# Patient Record
Sex: Female | Born: 1995 | Race: Black or African American | Hispanic: No | Marital: Single | Smoking: Never smoker
Health system: Southern US, Community
[De-identification: ages and names within clinical notes are randomized; demographics above are authoritative.]

---

## 2017-07-06 NOTE — L&D Delivery Note (Signed)
   Delivery Note At 12:51 AM a viable female was delivered via Vaginal, Spontaneous (Presentation:LOA, nuchal, delivered through ;  ).  APGAR: 8, 9; weight  .  pending After 1 minute, the cord was clamped and cut. 40 units of pitocin diluted in 1000cc LR was infused rapidly IV.  The placenta separated spontaneously and delivered via CCT and maternal pushing effort.  It was inspected and appears to be intact with a 3 VC Anesthesia:   Episiotomy: None Lacerations:  1st degree labia minora Suture Repair: 2.0 vicryl Est. Blood Loss (mL):  300  Mom to postpartum.  Baby to Couplet care / Skin to Skin.   The above was performed by Derrel Nip, MS-4, under my direct supervision and guidance.   Jasmin Wolfe 03/11/2018, 1:17 AM

## 2017-08-25 ENCOUNTER — Encounter: Payer: Self-pay | Admitting: *Deleted

## 2017-08-25 ENCOUNTER — Ambulatory Visit (INDEPENDENT_AMBULATORY_CARE_PROVIDER_SITE_OTHER): Payer: Medicaid Other | Admitting: Advanced Practice Midwife

## 2017-08-25 ENCOUNTER — Other Ambulatory Visit (HOSPITAL_COMMUNITY)
Admission: RE | Admit: 2017-08-25 | Discharge: 2017-08-25 | Disposition: A | Discharge status: Home / Self Care | Payer: Medicaid Other | Source: Ambulatory Visit | Attending: Advanced Practice Midwife | Admitting: Advanced Practice Midwife

## 2017-08-25 ENCOUNTER — Encounter: Payer: Self-pay | Admitting: Advanced Practice Midwife

## 2017-08-25 VITALS — BP 108/65 | HR 82 | Ht 68.5 in | Wt 236.0 lb

## 2017-08-25 DIAGNOSIS — Z3402 Encounter for supervision of normal first pregnancy, second trimester: Secondary | ICD-10-CM | POA: Insufficient documentation

## 2017-08-25 DIAGNOSIS — Z23 Encounter for immunization: Secondary | ICD-10-CM | POA: Diagnosis not present

## 2017-08-25 DIAGNOSIS — Z3400 Encounter for supervision of normal first pregnancy, unspecified trimester: Secondary | ICD-10-CM

## 2017-08-25 LAB — POCT PREGNANCY, URINE: PREG TEST UR: POSITIVE — AB

## 2017-08-25 NOTE — Progress Notes (Signed)
  Subjective:    Jasmin Wolfe is being seen today for her first obstetrical visit.  This is not a planned pregnancy. She is at 6148w0d gestation. Her obstetrical history is significant for obesity and was smoking marijuana prior to the pregnancy, but none now. . Relationship with FOB: significant other, not living together. Patient does intend to breast feed. Pregnancy history fully reviewed.  Patient reports no complaints.  Review of Systems:   Review of Systems  All other systems reviewed and are negative.   Objective:     BP 108/65   Pulse 82   Ht 5' 8.5" (1.74 m)   Wt 236 lb (107 kg)   LMP 05/26/2017   BMI 35.36 kg/m  Physical Exam  Nursing note and vitals reviewed. Constitutional: She is oriented to person, place, and time. She appears well-developed and well-nourished. No distress.  HENT:  Head: Normocephalic.  Cardiovascular: Normal rate.  Respiratory: Effort normal.  GI: Soft. There is no tenderness. There is no rebound.  Genitourinary:  Genitourinary Comments: External: no lesion Vagina: small amount of white discharge Cervix: pink, smooth, no CMT Uterus: ADA, +FHT with doppler    Neurological: She is alert and oriented to person, place, and time.  Skin: Skin is warm and dry.  Psychiatric: She has a normal mood and affect.    Maternal Exam:  Abdomen: Patient reports no abdominal tenderness. Introitus: Normal vulva. Normal vagina.    Fetal Exam Fetal Monitor Review: Mode: hand-held doppler probe.   Baseline rate: 158.         Assessment:    Pregnancy: G2P0010 There are no active problems to display for this patient.      Plan:     Initial labs drawn. Babyscripts app only Prenatal vitamins. Problem list reviewed and updated. AFP3 discussed: AFP only at next visit if desired . Role of ultrasound in pregnancy discussed; fetal survey: requested. Amniocentesis discussed: not indicated. Follow up in 4 weeks. 50% of 45 min visit spent on  counseling and coordination of care.     Thressa ShellerHeather Kaylie Ritter 08/25/2017

## 2017-08-25 NOTE — Patient Instructions (Signed)
Childbirth Education Options: [facility] Department Classes:  Childbirth education classes can help you get ready for a positive parenting experience. You can also meet other expectant parents and get free stuff for your baby. Each class runs for five weeks on the same night and costs $45 for the mother-to-be and her support person. Medicaid covers the cost if you are eligible. Call [phone] to register. [facility] Childbirth Education:  [phone] or [phone] or [email or web address]  Baby & Me Class: Discuss newborn & infant parenting and family adjustment issues with other new mothers in a relaxed environment. Each week brings a new speaker or baby-centered activity. We encourage new mothers to join [place] every Thursday at 11:00am. Babies birth until crawling. No registration or fee. Daddy [employer]: This course offers Dads-to-be the tools and knowledge needed to feel confident on their journey to becoming new fathers. Experienced dads, who have been trained as coaches, teach dads-to-be how to hold, comfort, diaper, swaddle and play with their infant while being able to support the new mom as well. A class for men taught by men. $25/dad Big Brother/Big Sister: Let your children share in the joy of a new brother or sister in this special class designed just for them. Class includes discussion about how families care for babies: swaddling, holding, diapering, safety as well as how they can be helpful in their new role. This class is designed for children ages 45 to 48, but any age is welcome. Please register each child individually. $5/child  Mom Talk: This mom-led group offers support and connection to mothers as they journey through the adjustments and struggles of that sometimes overwhelming first year after the birth of a child. Tuesdays at 10:00am and Thursdays at 6:00pm. Babies welcome. No registration or fee. Breastfeeding Support Group: This group is a mother-to-mother  support circle where moms have the opportunity to share their breastfeeding experiences. A Lactation Consultant is present for questions and concerns. Meets each Tuesday at 11:00am. No fee or registration. Breastfeeding Your Baby: Learn what to expect in the first days of breastfeeding your newborn.  This class will help you feel more confident with the skills needed to begin your breastfeeding experience. Many new mothers are concerned about breastfeeding after leaving the hospital. This class will also address the most common fears and challenges about breastfeeding during the first few weeks, months and beyond. (call for fee) Comfort Techniques and Tour: This 2 hour interactive class will provide you the opportunity to learn & practice hands-on techniques that can help relieve some of the discomfort of labor and encourage your baby to rotate toward the best position for birth. You and your partner will be able to try a variety of labor positions with birth balls and rebozos as well as practice breathing, relaxation, and visualization techniques. A tour of the [facility] is included with this class. $20 per registrant and support person Childbirth Class- Weekend Option: This class is a Weekend version of our Birth & Baby series. It is designed for parents who have a difficult time fitting several weeks of classes into their schedule. It covers the care of your newborn and the basics of labor and childbirth. It also includes a [place] [facility] and lunch. The class is held two consecutive days: beginning on Friday evening from 6:30 - 8:30 p.m. and the next day, Saturday from 9 a.m. - 4 p.m. (call for fee) Doren Custard Class: Interested in a waterbirth?  This  informational class will help you discover whether waterbirth is the right fit for you. Education about waterbirth itself, supplies you would need and how to assemble your support team is what you can  expect from this class. Some obstetrical practices require this class in order to pursue a waterbirth. (Not all obstetrical practices offer waterbirth-check with your healthcare provider.) Register only the expectant mom, but you are encouraged to bring your partner to class! Required if planning waterbirth, no fee. Infant/Child CPR: Parents, grandparents, babysitters, and friends learn Cardio-Pulmonary Resuscitation skills for infants and children. You will also learn how to treat both conscious and unconscious choking in infants and children. This Family & Friends program does not offer certification. Register each participant individually to ensure that enough mannequins are available. (Call for fee) Grandparent Love: Expecting a grandbaby? This class is for you! Learn about the latest infant care and safety recommendations and ways to support your own child as he or she transitions into the parenting role. Taught by Registered Nurses who are childbirth instructors, but most importantly...they are grandmothers too! $10/person. Childbirth Class- Natural Childbirth: This series of 5 weekly classes is for expectant parents who want to learn and practice natural methods of coping with the process of labor and childbirth. Relaxation, breathing, massage, visualization, role of the partner, and helpful positioning are highlighted. Participants learn how to be confident in their body's ability to give birth. This class will empower and help parents make informed decisions about their own care. Includes discussion that will help new parents transition into the immediate postpartum period. Maternity Care Center Tour of [facility] is included. We suggest taking this class between 25-32 weeks, but it's only a recommendation. $75 per registrant and one support person or $30 Medicaid. Childbirth Class- 3 week Series: This option of 3 weekly classes helps you and your labor partner prepare for childbirth. Newborn  care, labor & birth, cesarean birth, pain management, and comfort techniques are discussed and a Maternity Care Center Tour of [facility] is included. The class meets at the same time, on the same day of the week for 3 consecutive weeks beginning with the starting date you choose. $60 for registrant and one support person.  Marvelous Multiples: Expecting twins, triplets, or more? This class covers the differences in labor, birth, parenting, and breastfeeding issues that face multiples' parents. NICU tour is included. Led by a Certified Childbirth Educator who is the mother of twins. No fee. Caring for Baby: This class is for expectant and adoptive parents who want to learn and practice the most up-to-date newborn care for their babies. Focus is on birth through the first six weeks of life. Topics include feeding, bathing, diapering, crying, umbilical cord care, circumcision care and safe sleep. Parents learn to recognize symptoms of illness and when to call the pediatrician. Register only the mom-to-be and your partner or support person can plan to come with you! $10 per registrant and support person Childbirth Class- online option: This online class offers you the freedom to complete a Birth and Baby series in the comfort of your own home. The flexibility of this option allows you to review sections at your own pace, at times convenient to you and your support people. It includes additional video information, animations, quizzes, and extended activities. Get organized with helpful eClass tools, checklists, and trackers. Once you register online for the class, you will receive an email within a few days to accept the invitation and begin the class when the time   is right for you. The content will be available to you for 60 days. $60 for 60 days of online access for you and your support people.  Local Doulas: Natural Baby Doulas [email or web address] Tel:  [phone] [email or web address] [employer] [phone] [email or web address] The Labor Hassell Halim  (also do waterbirth tub rental) [phone] [email or web address] Triad Birth Doula [phone] [email or web address] NotebookDistributors.fi Sacred Rhythms  [phone] https://sacred-rhythms.com/ [employer] (PADA) [email or web address] https://www.frey.org/ [place] Birth and Baby  http://[email or web address]/ Considering Waterbirth? Guide for patients at [facility] [employer]  Why consider waterbirth?  . Gentle birth for babies . Less pain medicine used in labor . May allow for passive descent/less pushing . May reduce perineal tears  . More mobility and instinctive maternal position changes . Increased maternal relaxation . Reduced blood pressure in labor  Is waterbirth safe? What are the risks of infection, drowning or other complications?  . Infection: o Very low risk (3.7 % for tub vs 4.8% for bed) o 7 in 8000 waterbirths with documented infection o Poorly cleaned equipment most common cause o Slightly lower group B strep transmission rate  . Drowning o Maternal:  - Very low risk   - Related to seizures or fainting o Newborn:  - Very low risk. No evidence of increased risk of respiratory problems in multiple large studies - Physiological protection from breathing under water - Avoid underwater birth if there are any fetal complications - Once baby's head is out of the water, keep it out.  . Birth complication o Some reports of cord trauma, but risk decreased by bringing baby to surface gradually o No evidence of increased risk of shoulder dystocia. Mothers can usually change positions faster in water than in a bed, possibly aiding the maneuvers to free the shoulder.   You must attend a Doren Custard class at [facility]  3rd Wednesday of every month from 7-9pm  [employer] by calling [phone] or online at [email or web address]  Bring [place] the certificate from the class to your prenatal appointment  Meet with a midwife at 36 weeks to see if you can still plan a waterbirth and to sign the consent.   Purchase or rent the following supplies:   Water Birth Pool (Birth Pool in a Box or [place] for instance)  (Tubs start ~$125)  Single-use disposable tub liner designed for your brand of tub  New garden hose labeled "lead-free", "suitable for drinking water",  Electric drain pump to remove water (We recommend 792 gallon per hour or greater pump.)   Separate garden hose to remove the dirty water  Fish net  Bathing suit top (optional)  Long-handled mirror (optional)  Places to purchase or rent supplies  GotWebTools.is for tub purchases and supplies  Waterbirthsolutions.com for tub purchases and supplies  The Labor Ladies (www.thelaborladies.com) $275 for tub rental/set-up & take down/kit   [employer] (http://www.fleming.com/.htm) Information regarding doulas (labor support) who provide pool rentals  Our practice has a Birth Pool in a Box tub at the hospital that you may borrow on a first-come-first-served basis. It is your responsibility to to set up, clean and break down the tub. We cannot guarantee the availability of this tub in advance. You are responsible for bringing all accessories listed above. If you do not have all necessary supplies you cannot have a waterbirth.    Things that would prevent you from having a waterbirth:  Premature, <37wks  Previous cesarean birth  Presence of thick meconium-stained fluid  Multiple gestation (Twins,  triplets, etc.)  Uncontrolled diabetes or gestational diabetes requiring medication  Hypertension requiring medication or diagnosis of pre-eclampsia  Heavy vaginal bleeding  Non-reassuring fetal  heart rate  Active infection (MRSA, etc.). Group B Strep is NOT a contraindication for  waterbirth.  If your labor has to be induced and induction method requires continuous  monitoring of the baby's heart rate  Other risks/issues identified by your obstetrical provider  Please remember that birth is unpredictable. Under certain unforeseeable circumstances your provider may advise against giving birth in the tub. These decisions will be made on a case-by-case basis and with the safety of you and your baby as our highest priority.  Safe Medications in Pregnancy   Acne: Benzoyl Peroxide Salicylic Acid  Backache/Headache: Tylenol: 2 regular strength every 4 hours OR              2 Extra strength every 6 hours  Colds/Coughs/Allergies: Benadryl (alcohol free) 25 mg every 6 hours as needed Breath right strips Claritin Cepacol throat lozenges Chloraseptic throat spray Cold-Eeze- up to three times per day Cough drops, alcohol free Flonase (by prescription only) Guaifenesin Mucinex Robitussin DM (plain only, alcohol free) Saline nasal spray/drops Sudafed (pseudoephedrine) & Actifed ** use only after [redacted] weeks gestation and if you do not have high blood pressure Tylenol Vicks Vaporub Zinc lozenges Zyrtec   Constipation: Colace Ducolax suppositories Fleet enema Glycerin suppositories Metamucil Milk of magnesia Miralax Senokot Smooth move tea  Diarrhea: Kaopectate Imodium A-D  *NO pepto Bismol  Hemorrhoids: Anusol Anusol HC Preparation H Tucks  Indigestion: Tums Maalox Mylanta Zantac  Pepcid  Insomnia: Benadryl (alcohol free) 51m every 6 hours as needed Tylenol PM Unisom, no Gelcaps  Leg Cramps: Tums MagGel  Nausea/Vomiting:  Bonine Dramamine Emetrol Ginger extract Sea bands Meclizine  Nausea medication to take during pregnancy:  Unisom (doxylamine succinate 25 mg tablets) Take one tablet daily at bedtime. If symptoms are not adequately  controlled, the dose can be increased to a maximum recommended dose of two tablets daily (1/2 tablet in the morning, 1/2 tablet mid-afternoon and one at bedtime). Vitamin B6 1054mtablets. Take one tablet twice a day (up to 200 mg per day).  Skin Rashes: Aveeno products Benadryl cream or 2552mvery 6 hours as needed Calamine Lotion 1% cortisone cream  Yeast infection: Gyne-lotrimin 7 Monistat 7   **If taking multiple medications, please check labels to avoid duplicating the same active ingredients **take medication as directed on the label ** Do not exceed 4000 mg of tylenol in 24 hours **Do not take medications that contain aspirin or ibuprofen  AREA PEDLavon1 E. Wen[address] Circleu[address] 400AmericusC  [id number] - [phone] - [phone] PEDIATRICS OF GRETularosaa418 Be[address] StreetiNemahaeDeer CreekC [id number] - [phone] - 336[address] ParSouth GreeleyC  [id number] - [phone] - [phone] Llanos1Ganadolm[address] DriveuiCape May PointGrePhilipC  27423557one - [phone] - [phone] Springs0892 Pendergast StreeteSan DimasC  27417616one - [phone] - [phone] [address]uite 203009gCushingC  27238182one - [phone] - 336Sand Ridge26 East Rockledge StreetuiPresidioeClydeC  27493810one - [phone] - [phone]  [facility] FAMILY MEDICINE AT [facility] [address] [place], [place], [place]  [address] Phone - [phone]   Fax [phone]  [facility] FAMILY MEDICINE AT [facility] [address] [place], [place]  [address] Phone - [phone]   Fax - [phone] [facility] [place] [address] [place], [place]  [address] Phone -  [phone]   Fax - [phone]  [place] [address], [place]  [address] Phone - [phone]   Fax - [phone]  [facility] FAMILY MEDICINE AT [place], [place], [place]  [address] Phone - [phone]   Fax - [place] [address] [place], [place]  [address] Phone - [phone]   Fax - [phone]  [facility] [address] [place], [place]  [address] Phone - [place], [place]  [address] Phone - [phone]   Fax - [place] [address], [place], [place]  [address] Phone - [phone]   Fax - [phone]  [place] [address] [place], [place]  [address] Phone - [phone]   Fax - [place]. [place], [place]  [address] Phone - [phone]   Fax - [place], [place], [place]  [address] Phone - [phone]   Fax - [place] [address] [place], [place]  [address] Phone - [phone]   Fax - [phone]  DAVID RUBIN [address], [place], [place]  [address] Phone - [phone]   Fax - [place] [address], [place], [place]  [address] Phone - [phone]   Fax - [phone]  [place] [address] [place], [place]  [address] Phone - [phone]   Fax - [phone] Arnaldo Natal [address] [place], [place]  [address] Phone - [phone]   Fax - [place] [address] [place], [place]  [address] Phone - [phone]   Fax - [place] [address] [place], [place]  [address] Phone -  [phone]   Fax - [phone]  [place] MD [address] [place] [address] Phone [phone]  Fax [phone]  Places to have your son circumcised:    [facility] [phone] while you are in hospital  [facility] [phone] $244 by 4 wks  [facility] [phone] $175 by 2 wks  [facility] [phone] $250 by 7 days [facility] [phone] $269 by 4 wks  These prices sometimes change but are roughly what you can expect to pay. Please call and confirm pricing.   Circumcision is considered an elective/non-medically necessary procedure. There are many reasons parents decide to have their sons circumsized. During the first year of life circumcised males have a reduced risk of urinary tract infections but after this year the rates between circumcised males and uncircumcised males are the same.  It is safe to have your son circumcised outside of the hospital and the places above perform them regularly.   Deciding about Circumcision in Baby Boys  (Up-to-date The Basics)  What is circumcision?  Circumcision is a surgery that removes the skin that covers the tip of the penis, called the "  foreskin" Circumcision is usually done when a boy is between 24 and 61 days old. In the [place], circumcision is common. In some other countries, fewer boys are circumcised. Circumcision is a common tradition in some religions.  Should I have my baby boy circumcised?  There is no easy answer. Circumcision has some benefits. But it also has risks. After talking with your doctor, you will have to decide for yourself what is right for your family.  What are the benefits of circumcision?  Circumcised boys seem to have slightly lower rates of: ?Urinary tract infections ?Swelling of  the opening at the tip of the penis Circumcised men seem to have slightly lower rates of: ?Urinary tract infections ?Swelling of the opening at the tip of the penis ?Penis cancer ?HIV and other infections that you catch during sex ?Cervical cancer in the women they have sex with Even so, in the [place], the risks of these problems are small - even in boys and men who have not been circumcised. Plus, boys and men who are not circumcised can reduce these extra risks by: ?Cleaning their penis well ?Using condoms during sex  What are the risks of circumcision?  Risks include: ?Bleeding or infection from the surgery ?Damage to or amputation of the penis ?A chance that the doctor will cut off too much or not enough of the foreskin ?A chance that sex won't feel as good later in life Only about 1 out of every 200 circumcisions leads to problems. There is also a chance that your health insurance won't pay for circumcision.  How is circumcision done in baby boys?  First, the baby gets medicine for pain relief. This might be a cream on the skin or a shot into the base of the penis. Next, the doctor cleans the baby's penis well. Then he or she uses special tools to cut off the foreskin. Finally, the doctor wraps a bandage (called gauze) around the baby's penis. If you have your baby circumcised, his doctor or nurse will give you instructions on how to care for him after the surgery. It is important that you follow those instructions carefully.

## 2017-08-26 LAB — CYTOLOGY - PAP
CHLAMYDIA, DNA PROBE: NEGATIVE
Diagnosis: NEGATIVE
Neisseria Gonorrhea: NEGATIVE

## 2017-08-27 LAB — URINE CULTURE, OB REFLEX

## 2017-08-27 LAB — CULTURE, OB URINE

## 2017-08-31 ENCOUNTER — Other Ambulatory Visit: Payer: Self-pay | Admitting: Advanced Practice Midwife

## 2017-08-31 ENCOUNTER — Encounter: Payer: Self-pay | Admitting: Advanced Practice Midwife

## 2017-08-31 DIAGNOSIS — Z3400 Encounter for supervision of normal first pregnancy, unspecified trimester: Secondary | ICD-10-CM

## 2017-08-31 LAB — CYSTIC FIBROSIS GENE TEST

## 2017-09-03 ENCOUNTER — Encounter: Payer: Self-pay | Admitting: *Deleted

## 2017-09-03 ENCOUNTER — Other Ambulatory Visit: Payer: Self-pay | Admitting: Advanced Practice Midwife

## 2017-09-03 DIAGNOSIS — Z3400 Encounter for supervision of normal first pregnancy, unspecified trimester: Secondary | ICD-10-CM

## 2017-09-03 LAB — OBSTETRIC PANEL, INCLUDING HIV
Antibody Screen: NEGATIVE
BASOS ABS: 0 10*3/uL (ref 0.0–0.2)
Basos: 0 %
EOS (ABSOLUTE): 0.1 10*3/uL (ref 0.0–0.4)
EOS: 2 %
HEMOGLOBIN: 11.5 g/dL (ref 11.1–15.9)
HIV Screen 4th Generation wRfx: NONREACTIVE
Hematocrit: 36.2 % (ref 34.0–46.6)
Hepatitis B Surface Ag: NEGATIVE
IMMATURE GRANS (ABS): 0 10*3/uL (ref 0.0–0.1)
Immature Granulocytes: 0 %
Lymphocytes Absolute: 1.3 10*3/uL (ref 0.7–3.1)
Lymphs: 24 %
MCH: 28.7 pg (ref 26.6–33.0)
MCHC: 31.8 g/dL (ref 31.5–35.7)
MCV: 90 fL (ref 79–97)
MONOS ABS: 0.4 10*3/uL (ref 0.1–0.9)
Monocytes: 8 %
NEUTROS PCT: 66 %
Neutrophils Absolute: 3.6 10*3/uL (ref 1.4–7.0)
Platelets: 257 10*3/uL (ref 150–379)
RBC: 4.01 x10E6/uL (ref 3.77–5.28)
RDW: 13.5 % (ref 12.3–15.4)
RPR Ser Ql: NONREACTIVE
Rh Factor: POSITIVE
Rubella Antibodies, IGG: 2.63 index (ref >0.99)
WBC: 5.5 10*3/uL (ref 3.4–10.8)

## 2017-09-03 LAB — SMN1 COPY NUMBER ANALYSIS (SMA CARRIER SCREENING)

## 2017-09-03 LAB — HEMOGLOBINOPATHY EVALUATION
Ferritin: 130 ng/mL (ref 15–150)
HGB A: 97.8 % (ref 96.4–98.8)
HGB F QUANT: 0 % (ref 0.0–2.0)
HGB SOLUBILITY: NEGATIVE
Hgb A2 Quant: 2.2 % (ref 1.8–3.2)
Hgb C: 0 %
Hgb S: 0 %
Hgb Variant: 0 %

## 2017-09-08 ENCOUNTER — Encounter: Payer: Self-pay | Admitting: Family Medicine

## 2017-09-08 DIAGNOSIS — O99212 Obesity complicating pregnancy, second trimester: Secondary | ICD-10-CM | POA: Insufficient documentation

## 2017-09-08 DIAGNOSIS — E669 Obesity, unspecified: Secondary | ICD-10-CM | POA: Insufficient documentation

## 2017-09-09 ENCOUNTER — Encounter: Payer: Self-pay | Admitting: Advanced Practice Midwife

## 2017-09-22 ENCOUNTER — Encounter: Payer: Self-pay | Admitting: Advanced Practice Midwife

## 2017-09-22 ENCOUNTER — Ambulatory Visit (INDEPENDENT_AMBULATORY_CARE_PROVIDER_SITE_OTHER): Payer: Medicaid Other | Admitting: Advanced Practice Midwife

## 2017-09-22 DIAGNOSIS — Z3402 Encounter for supervision of normal first pregnancy, second trimester: Secondary | ICD-10-CM

## 2017-09-22 DIAGNOSIS — Z3400 Encounter for supervision of normal first pregnancy, unspecified trimester: Secondary | ICD-10-CM

## 2017-09-22 MED ORDER — PRENATAL GUMMIES/DHA & FA 0.4-32.5 MG PO CHEW: 1.0000 | CHEWABLE_TABLET | Freq: Every day | ORAL | 11 refills | Status: AC

## 2017-09-22 MED ORDER — DOCUSATE SODIUM 100 MG PO CAPS: 100.0000 mg | ORAL_CAPSULE | Freq: Two times a day (BID) | ORAL | 3 refills | Status: DC

## 2017-09-22 MED ORDER — ONDANSETRON 8 MG PO TBDP: 8.0000 mg | ORAL_TABLET | Freq: Three times a day (TID) | ORAL | 2 refills | Status: DC | PRN

## 2017-09-22 NOTE — Patient Instructions (Signed)
AREA PEDIATRIC/FAMILY PRACTICE PHYSICIANS   [facility] [address], [address] [place], [place]  [address] Phone - [phone]   Fax - [phone]  [facility] [address] [place], [place] [address] Phone - [phone]   Fax - [phone]  J[facility] AM[facility] [address] [place], [place]  [address] Phone - [phone]   Fax - [phone]  [facility] [address] [place], [place]  [address] Phone - [phone]   Fax - [phone]  [facility] [address] [place], [place]  [address] Phone - [phone]   Fax - [phone]  [facility] [address] [place], [place]  [address] Phone - [phone]   Fax - [phone]  [facility] [address] [place], [place]  [address] Phone - [phone]   Fax - [phone]  [facility] [address], [address] [place], [place]  [address] Phone - [phone]   Fax - [phone]  [facility] [address] [place], [place]  [address] Phone - [phone]   Fax - [phone] [facility] [address] [place], [place]  [address] Phone - [phone]   Fax - [phone]  [facility] [address] [place], [place]  [address] Phone - [phone]   Fax - [phone]  [facility] [address] [place], [place]  [address] Phone - [phone]   Fax - [phone]  [facility] [address] [place], [place]  [address] Phone - [phone]   Fax - [phone]  SHILPA GOSRANI [address] [place], [place]  [address] Phone - [phone]  [facility] [address] [place], [place]  [address] Phone - [phone]   Fax - [phone]  [facility] [address] [place], [place]  [address] Phone - [phone]   Fax - [phone]  [facility] [address] [place], [place]  [address] Phone - [phone]   Fax - [phone]  [facility] [address] [place], [place]  [address] Phone - [phone]   Fax - [phone]   [facility] [address] [place], [place]  [address] Phone - [phone]   Fax - [phone]  [facility] [address] [place], [place]  [address] Phone - [phone]   Fax - [phone]  DAVID RUBIN [address] [place], [place]  [address] Phone - [phone]   Fax - [phone]  [facility] [address] [place], [place]  [address] Phone - [phone]   Fax - [phone]  [facility] [address] , [place]  [address] Phone - [phone]   Fax - [phone] [facility] [address] [place], [place]  [address] Phone - [phone]   Fax - [phone]  [facility] [address] [place], [place]  [address] Phone - [phone]   Fax - [phone]  [facility] [address] [place], [place]  [address] Phone - [phone]   Fax - [phone]  [facility] Charlene Flemming MD [address] [place]  [address] Phone [phone]  Fax [phone]  Childbirth Education Options: [facility] Classes:  Childbirth education classes can help you   get ready for a positive parenting experience. You can also meet other expectant parents and get free stuff for your baby. Each class runs for five weeks on the same night and costs $45 for the mother-to-be and her support person. Medicaid covers the cost if you are eligible. Call [phone] to register. [facility] Childbirth Education:  [phone] or [phone] or  [email or web address]  Baby & Me Class: Discuss newborn & infant parenting and family adjustment issues with other new mothers in a relaxed environment. Each week brings a new speaker or baby-centered activity. We encourage new mothers to join us every Thursday at 11:00am. Babies birth until crawling. No registration or fee. Daddy Boot Camp: This course offers Dads-to-be the tools and knowledge needed to feel confident on their journey to becoming new fathers. Experienced dads, who have been trained as coaches, teach dads-to-be how to hold, comfort, diaper, swaddle and play with their infant while being able to support the new mom as well. A class for men taught by men. $25/dad Big Brother/Big Sister: Let your children share in the joy of a new brother or sister in this special class designed just for them. Class includes discussion about how families care for babies: swaddling, holding, diapering, safety as well as how they can be helpful in their new role. This class is designed for children ages 2 to 6, but any age is welcome. Please register each child individually. $5/child  Mom Talk: This mom-led group offers support and connection to mothers as they journey through the adjustments and struggles of that sometimes overwhelming first year after the birth of a child. Tuesdays at 10:00am and Thursdays at 6:00pm. Babies welcome. No registration or fee. Breastfeeding Support Group: This group is a mother-to-mother support circle where moms have the opportunity to share their breastfeeding experiences. A Lactation Consultant is present for questions and concerns. Meets each Tuesday at 11:00am. No fee or registration. Breastfeeding Your Baby: Learn what to expect in the first days of breastfeeding your newborn.  This class will help you feel more confident with the skills needed to begin your breastfeeding experience. Many new mothers are concerned about breastfeeding after leaving the hospital. This class  will also address the most common fears and challenges about breastfeeding during the first few weeks, months and beyond. (call for fee) Comfort Techniques and Tour: This 2 hour interactive class will provide you the opportunity to learn & practice hands-on techniques that can help relieve some of the discomfort of labor and encourage your baby to rotate toward the best position for birth. You and your partner will be able to try a variety of labor positions with birth balls and rebozos as well as practice breathing, relaxation, and visualization techniques. A tour of the [facility] is included with this class. $20 per registrant and support person Childbirth Class- Weekend Option: This class is a Weekend version of our Birth & Baby series. It is designed for parents who have a difficult time fitting several weeks of classes into their schedule. It covers the care of your newborn and the basics of labor and childbirth. It also includes a Maternity Care Center Tour of [facility] and lunch. The class is held two consecutive days: beginning on Friday evening from 6:30 - 8:30 p.m. and the next day, Saturday from 9 a.m. - 4 p.m. (call for fee) Waterbirth Class: Interested in a waterbirth?  This informational class will help you discover whether waterbirth is the right fit for you.   Education about waterbirth itself, supplies you would need and how to assemble your support team is what you can expect from this class. Some obstetrical practices require this class in order to pursue a waterbirth. (Not all obstetrical practices offer waterbirth-check with your healthcare provider.) Register only the expectant mom, but you are encouraged to bring your partner to class! Required if planning waterbirth, no fee. Infant/Child CPR: Parents, grandparents, babysitters, and friends learn Cardio-Pulmonary Resuscitation skills for infants and children. You will also learn how to treat both conscious  and unconscious choking in infants and children. This Family & Friends program does not offer certification. Register each participant individually to ensure that enough mannequins are available. (Call for fee) Grandparent Love: Expecting a grandbaby? This class is for you! Learn about the latest infant care and safety recommendations and ways to support your own child as he or she transitions into the parenting role. Taught by Registered Nurses who are childbirth instructors, but most importantly...they are grandmothers too! $10/person. Childbirth Class- Natural Childbirth: This series of 5 weekly classes is for expectant parents who want to learn and practice natural methods of coping with the process of labor and childbirth. Relaxation, breathing, massage, visualization, role of the partner, and helpful positioning are highlighted. Participants learn how to be confident in their body's ability to give birth. This class will empower and help parents make informed decisions about their own care. Includes discussion that will help new parents transition into the immediate postpartum period. [facility] is included. We suggest taking this class between 25-32 weeks, but it's only a recommendation. $75 per registrant and one support person or $30 Medicaid. Childbirth Class- 3 week Series: This option of 3 weekly classes helps you and your labor partner prepare for childbirth. Newborn care, labor & birth, cesarean birth, pain management, and comfort techniques are discussed and a [place] [facility] is included. The class meets at the same time, on the same day of the week for 3 consecutive weeks beginning with the starting date you choose. $60 for registrant and one support person.  Marvelous Multiples: Expecting twins, triplets, or more? This class covers the differences in labor, birth, parenting, and breastfeeding issues that face multiples' parents.  NICU tour is included. Led by a Certified Childbirth Educator who is the mother of twins. No fee. Caring for Baby: This class is for expectant and adoptive parents who want to learn and practice the most up-to-date newborn care for their babies. Focus is on birth through the first six weeks of life. Topics include feeding, bathing, diapering, crying, umbilical cord care, circumcision care and safe sleep. Parents learn to recognize symptoms of illness and when to call the pediatrician. Register only the mom-to-be and your partner or support person can plan to come with you! $10 per registrant and support person Childbirth Class- online option: This online class offers you the freedom to complete a Birth and Baby series in the comfort of your own home. The flexibility of this option allows you to review sections at your own pace, at times convenient to you and your support people. It includes additional video information, animations, quizzes, and extended activities. Get organized with helpful eClass tools, checklists, and trackers. Once you register online for the class, you will receive an email within a few days to accept the invitation and begin the class when the time is right for you. The content will be available to you for 60 days. $  60 for 60 days of online access for you and your support people.  Local Doulas: Natural Baby Doulas [email or web address] Tel: [phone] [email or web address] [employer] [phone] [email or web address] The Labor Merla RichesLadies  (also do waterbirth tub rental) [phone] [email or web address] [facility] [phone] [email or web address] [facility]  [phone] [email or web address] [employer] (PADA) [email or web address] [place] [facility]   [email or web address]  Safe Medications in Pregnancy   Acne: Benzoyl Peroxide Salicylic Acid  Backache/Headache: Tylenol: 2 regular strength every 4 hours OR              2 Extra strength every 6 hours  Colds/Coughs/Allergies: Benadryl (alcohol free) 25 mg every 6 hours as needed Breath right strips Claritin Cepacol throat lozenges Chloraseptic throat spray Cold-Eeze- up to three times per day Cough drops, alcohol free Flonase (by prescription only) Guaifenesin Mucinex Robitussin DM (plain only, alcohol free) Saline nasal spray/drops Sudafed (pseudoephedrine) & Actifed ** use only after [redacted] weeks gestation and if you do not have high blood pressure Tylenol Vicks Vaporub Zinc lozenges Zyrtec   Constipation: Colace Ducolax suppositories Fleet enema Glycerin suppositories Metamucil Milk of magnesia Miralax Senokot Smooth move tea  Diarrhea: Kaopectate Imodium A-D  *NO pepto Bismol  Hemorrhoids: Anusol Anusol HC Preparation H Tucks  Indigestion: Tums Maalox Mylanta Zantac  Pepcid  Insomnia: Benadryl (alcohol free) 25mg  every 6 hours as needed Tylenol PM Unisom, no Gelcaps  Leg Cramps: Tums MagGel  Nausea/Vomiting:  Bonine Dramamine Emetrol Ginger extract Sea bands Meclizine  Nausea medication to take during pregnancy:  Unisom (doxylamine succinate 25 mg tablets) Take one tablet daily at bedtime. If symptoms are not adequately controlled, the dose can be increased to a maximum recommended dose of two tablets daily (1/2 tablet in the morning, 1/2 tablet mid-afternoon and one at bedtime). Vitamin B6 100mg  tablets. Take one tablet twice a day (up to 200 mg per day).  Skin Rashes: Aveeno products Benadryl cream or 25mg  every 6 hours as needed Calamine Lotion 1% cortisone cream  Yeast infection: Gyne-lotrimin 7 Monistat 7   **If taking multiple medications, please check labels to avoid duplicating the same active  ingredients **take medication as directed on the label ** Do not exceed 4000 mg of tylenol in 24 hours **Do not take medications that contain aspirin or ibuprofen

## 2017-09-22 NOTE — Progress Notes (Signed)
   PRENATAL VISIT NOTE  Subjective:  Jasmin Wolfe is a 22 y.o. G2P0010 at 4379w0d being seen today for ongoing prenatal care.  She is currently monitored for the following issues for this low-risk pregnancy and has Supervision of normal first pregnancy, antepartum; Obesity affecting pregnancy, antepartum; and Obesity, Class II, BMI 35-39.9 on their problem list.  Patient reports no complaints.  Contractions: Not present. Vag. Bleeding: None.  Movement: Absent. Denies leaking of fluid.   Some nausea and constipation.   The following portions of the patient's history were reviewed and updated as appropriate: allergies, current medications, past family history, past medical history, past social history, past surgical history and problem list. Problem list updated.  Objective:   Vitals:   09/22/17 0827  BP: 120/66  Pulse: 80  Weight: 230 lb 6.4 oz (104.5 kg)    Fetal Status: Fetal Heart Rate (bpm): 142   Movement: Absent     General:  Alert, oriented and cooperative. Patient is in no acute distress.  Skin: Skin is warm and dry. No rash noted.   Cardiovascular: Normal heart rate noted  Respiratory: Normal respiratory effort, no problems with respiration noted  Abdomen: Soft, gravid, appropriate for gestational age.  Pain/Pressure: Absent     Pelvic: Cervical exam deferred        Extremities: Normal range of motion.  Edema: None  Mental Status:  Normal mood and affect. Normal behavior. Normal judgment and thought content.   Assessment and Plan:  Pregnancy: G2P0010 at 7379w0d  1. Supervision of normal first pregnancy, antepartum  - US MFM OB COMP + 14 WK; Future - AFP only - RX: prenatal gummies, colace, zofran   Preterm labor symptoms and general obstetric precautions including but not limited to vaginal bleeding, contractions, leaking of fluid and fetal movement were reviewed in detail with the patient. Please refer to After Visit Summary for other counseling recommendations.    Return in about 4 weeks (around 10/20/2017).   Thressa ShellerHeather Hogan, CNM

## 2017-09-22 NOTE — Progress Notes (Signed)
Pt desires Rx for nausea and prenatal gummies.  Pt reports constipation - recommend Colace and increase po water intake and fruits and vegetables.

## 2017-09-24 LAB — AFP, SERUM, OPEN SPINA BIFIDA
AFP MoM: 1.39
AFP Value: 43.6 ng/mL
GEST. AGE ON COLLECTION DATE: 17 wk
Maternal Age At EDD: 22.2 yr
OSBR RISK 1 IN: 7407
Test Results:: NEGATIVE
WEIGHT: 230 [lb_av]

## 2017-10-08 ENCOUNTER — Ambulatory Visit (HOSPITAL_COMMUNITY)
Admission: RE | Admit: 2017-10-08 | Discharge: 2017-10-08 | Disposition: A | Discharge status: Home / Self Care | Payer: Medicaid Other | Source: Ambulatory Visit | Attending: Advanced Practice Midwife | Admitting: Advanced Practice Midwife

## 2017-10-08 ENCOUNTER — Other Ambulatory Visit: Payer: Self-pay | Admitting: Advanced Practice Midwife

## 2017-10-08 DIAGNOSIS — Z3689 Encounter for other specified antenatal screening: Secondary | ICD-10-CM | POA: Diagnosis not present

## 2017-10-08 DIAGNOSIS — Z3A19 19 weeks gestation of pregnancy: Secondary | ICD-10-CM | POA: Diagnosis not present

## 2017-10-08 DIAGNOSIS — Z3400 Encounter for supervision of normal first pregnancy, unspecified trimester: Secondary | ICD-10-CM

## 2017-10-08 DIAGNOSIS — O99212 Obesity complicating pregnancy, second trimester: Secondary | ICD-10-CM | POA: Diagnosis not present

## 2017-10-20 ENCOUNTER — Ambulatory Visit (INDEPENDENT_AMBULATORY_CARE_PROVIDER_SITE_OTHER): Payer: Medicaid Other | Admitting: Medical

## 2017-10-20 VITALS — BP 102/60 | HR 73 | Wt 230.0 lb

## 2017-10-20 DIAGNOSIS — IMO0002 Reserved for concepts with insufficient information to code with codable children: Secondary | ICD-10-CM

## 2017-10-20 DIAGNOSIS — Z0489 Encounter for examination and observation for other specified reasons: Secondary | ICD-10-CM

## 2017-10-20 DIAGNOSIS — Z0482 Encounter for examination and observation of victim following forced labor exploitation: Secondary | ICD-10-CM

## 2017-10-20 NOTE — Progress Notes (Signed)
   PRENATAL VISIT NOTE  Subjective:  Jasmin Wolfe is a 22 y.o. G2P0010 at 7567w0d being seen today for ongoing prenatal care.  She is currently monitored for the following issues for this low-risk pregnancy and has Supervision of normal first pregnancy, antepartum; Obesity affecting pregnancy, antepartum; and Obesity, Class II, BMI 35-39.9 on their problem list.  Patient reports allergy symptoms.  Contractions: Not present. Vag. Bleeding: None.  Movement: Present. Denies leaking of fluid.   The following portions of the patient's history were reviewed and updated as appropriate: allergies, current medications, past family history, past medical history, past social history, past surgical history and problem list. Problem list updated.  Objective:   Vitals:   10/20/17 0822  BP: 102/60  Pulse: 73  Weight: 230 lb (104.3 kg)    Fetal Status: Fetal Heart Rate (bpm): 142   Movement: Present     General:  Alert, oriented and cooperative. Patient is in no acute distress.  Skin: Skin is warm and dry. No rash noted.   Cardiovascular: Normal heart rate noted  Respiratory: Normal respiratory effort, no problems with respiration noted  Abdomen: Soft, gravid, appropriate for gestational age.  Pain/Pressure: Present     Pelvic: Cervical exam deferred        Extremities: Normal range of motion.  Edema: None  Mental Status: Normal mood and affect. Normal behavior. Normal judgment and thought content.   Assessment and Plan:  Pregnancy: G2P0010 at 3367w0d  1. Evaluate anatomy not seen on prior sonogram - US MFM OB FOLLOW UP; scheduled  Preterm labor/ second trimester symptoms and general obstetric precautions including but not limited to vaginal bleeding, contractions, leaking of fluid and fetal movement were reviewed in detail with the patient. Please refer to After Visit Summary for other counseling recommendations.  Return in about 1 month (around 11/17/2017) for LOB.  Vonzella NippleJulie Florita Nitsch, PA-C

## 2017-10-20 NOTE — Patient Instructions (Signed)
Second Trimester of Pregnancy The second trimester is from week 13 through week 28, month 4 through 6. This is often the time in pregnancy that you feel your best. Often times, morning sickness has lessened or quit. You may have more energy, and you may get hungry more often. Your unborn baby (fetus) is growing rapidly. At the end of the sixth month, he or she is about 9 inches long and weighs about 1 pounds. You will likely feel the baby move (quickening) between 18 and 20 weeks of pregnancy. Follow these instructions at home:  Avoid all smoking, herbs, and alcohol. Avoid drugs not approved by your doctor.  Do not use any tobacco products, including cigarettes, chewing tobacco, and electronic cigarettes. If you need help quitting, ask your doctor. You may get counseling or other support to help you quit.  Only take medicine as told by your doctor. Some medicines are safe and some are not during pregnancy.  Exercise only as told by your doctor. Stop exercising if you start having cramps.  Eat regular, healthy meals.  Wear a good support bra if your breasts are tender.  Do not use hot tubs, steam rooms, or saunas.  Wear your seat belt when driving.  Avoid raw meat, uncooked cheese, and liter boxes and soil used by cats.  Take your prenatal vitamins.  Take 1500-2000 milligrams of calcium daily starting at the 20th week of pregnancy until you deliver your baby.  Try taking medicine that helps you poop (stool softener) as needed, and if your doctor approves. Eat more fiber by eating fresh fruit, vegetables, and whole grains. Drink enough fluids to keep your pee (urine) clear or pale yellow.  Take warm water baths (sitz baths) to soothe pain or discomfort caused by hemorrhoids. Use hemorrhoid cream if your doctor approves.  If you have puffy, bulging veins (varicose veins), wear support hose. Raise (elevate) your feet for 15 minutes, 3-4 times a day. Limit salt in your diet.  Avoid heavy  lifting, wear low heals, and sit up straight.  Rest with your legs raised if you have leg cramps or low back pain.  Visit your dentist if you have not gone during your pregnancy. Use a soft toothbrush to brush your teeth. Be gentle when you floss.  You can have sex (intercourse) unless your doctor tells you not to.  Go to your doctor visits. Get help if:  You feel dizzy.  You have mild cramps or pressure in your lower belly (abdomen).  You have a nagging pain in your belly area.  You continue to feel sick to your stomach (nauseous), throw up (vomit), or have watery poop (diarrhea).  You have bad smelling fluid coming from your vagina.  You have pain with peeing (urination). Get help right away if:  You have a fever.  You are leaking fluid from your vagina.  You have spotting or bleeding from your vagina.  You have severe belly cramping or pain.  You lose or gain weight rapidly.  You have trouble catching your breath and have chest pain.  You notice sudden or extreme puffiness (swelling) of your face, hands, ankles, feet, or legs.  You have not felt the baby move in over an hour.  You have severe headaches that do not go away with medicine.  You have vision changes. This information is not intended to replace advice given to you by your health care provider. Make sure you discuss any questions you have with your health care   provider. Document Released: 09/16/2009 Document Revised: 11/28/2015 Document Reviewed: 08/23/2012 Elsevier Interactive Patient Education  2017 [employer]vier Inc.  

## 2017-11-05 ENCOUNTER — Other Ambulatory Visit: Payer: Self-pay | Admitting: Medical

## 2017-11-05 ENCOUNTER — Ambulatory Visit (HOSPITAL_COMMUNITY)
Admission: RE | Admit: 2017-11-05 | Discharge: 2017-11-05 | Disposition: A | Discharge status: Home / Self Care | Payer: Medicaid Other | Source: Ambulatory Visit | Attending: Medical | Admitting: Medical

## 2017-11-05 DIAGNOSIS — O99212 Obesity complicating pregnancy, second trimester: Secondary | ICD-10-CM

## 2017-11-05 DIAGNOSIS — Z0489 Encounter for examination and observation for other specified reasons: Secondary | ICD-10-CM

## 2017-11-05 DIAGNOSIS — Z362 Encounter for other antenatal screening follow-up: Secondary | ICD-10-CM

## 2017-11-05 DIAGNOSIS — Z3A23 23 weeks gestation of pregnancy: Secondary | ICD-10-CM | POA: Insufficient documentation

## 2017-11-05 DIAGNOSIS — IMO0002 Reserved for concepts with insufficient information to code with codable children: Secondary | ICD-10-CM

## 2017-11-24 ENCOUNTER — Ambulatory Visit (INDEPENDENT_AMBULATORY_CARE_PROVIDER_SITE_OTHER): Payer: Medicaid Other | Admitting: Medical

## 2017-11-24 VITALS — BP 105/66 | HR 85 | Wt 225.2 lb

## 2017-11-24 DIAGNOSIS — Z3482 Encounter for supervision of other normal pregnancy, second trimester: Secondary | ICD-10-CM

## 2017-11-24 DIAGNOSIS — Z3400 Encounter for supervision of normal first pregnancy, unspecified trimester: Secondary | ICD-10-CM

## 2017-11-24 NOTE — Patient Instructions (Addendum)
Nausea in pregnancy is when you feel sick to your stomach (nauseous) during pregnancy. You may feel sick to your stomach and throw up (vomit). You may feel sick in the morning, but you can feel this way any time of day. Some women feel very sick to their stomach and cannot stop throwing up (hyperemesis gravidarum). Follow these instructions at home:  Only take medicines as told by your doctor.  Take multivitamins as told by your doctor. Taking multivitamins before getting pregnant can stop or lessen the harshness of morning sickness.  Eat dry toast or unsalted crackers before getting out of bed.  Eat 5 to 6 small meals a day.  Eat dry and bland foods like rice and baked potatoes.  Do not drink liquids with meals. Drink between meals.  Do not eat greasy, fatty, or spicy foods.  Have someone cook for you if the smell of food causes you to feel sick or throw up.  If you feel sick to your stomach after taking prenatal vitamins, take them at night or with a snack.  Eat protein when you need a snack (nuts, yogurt, cheese).  Eat unsweetened gelatins for dessert.  Wear a bracelet used for sea sickness (acupressure wristband).  Go to a doctor that puts thin needles into certain body points (acupuncture) to improve how you feel.  Do not smoke.  Use a humidifier to keep the air in your house free of odors.  Get lots of fresh air. Contact a doctor if:  You need medicine to feel better.  You feel dizzy or lightheaded.  You are losing weight. Get help right away if:  You feel very sick to your stomach and cannot stop throwing up.  You pass out (faint). This information is not intended to replace advice given to you by your health care provider. Make sure you discuss any questions you have with your health care provider. Document Released: 07/30/2004 Document Revised: 11/28/2015 Document Reviewed: 12/07/2012 Elsevier Interactive Patient Education  2017 Door  Education Options: [facility] Department Classes:  Childbirth education classes can help you get ready for a positive parenting experience. You can also meet other expectant parents and get free stuff for your baby. Each class runs for five weeks on the same night and costs $45 for the mother-to-be and her support person. Medicaid covers the cost if you are eligible. Call [phone] to register. [facility] Childbirth Education:  [phone] or [phone] or [email or web address]  Baby & Me Class: Discuss newborn & infant parenting and family adjustment issues with other new mothers in a relaxed environment. Each week brings a new speaker or baby-centered activity. We encourage new mothers to join [place] every Thursday at 11:00am. Babies birth until crawling. No registration or fee. Daddy [employer]: This course offers Dads-to-be the tools and knowledge needed to feel confident on their journey to becoming new fathers. Experienced dads, who have been trained as coaches, teach dads-to-be how to hold, comfort, diaper, swaddle and play with their infant while being able to support the new mom as well. A class for men taught by men. $25/dad Big Brother/Big Sister: Let your children share in the joy of a new brother or sister in this special class designed just for them. Class includes discussion about how families care for babies: swaddling, holding, diapering, safety as well as how they can be helpful in their new role. This class is designed for children ages 27 to 83, but any age is welcome. Please register  each child individually. $5/child  Mom Talk: This mom-led group offers support and connection to mothers as they journey through the adjustments and struggles of that sometimes overwhelming first year after the birth of a child. Tuesdays at 10:00am and Thursdays at 6:00pm. Babies welcome. No registration or fee. Breastfeeding Support Group: This group is a mother-to-mother support  circle where moms have the opportunity to share their breastfeeding experiences. A Lactation Consultant is present for questions and concerns. Meets each Tuesday at 11:00am. No fee or registration. Breastfeeding Your Baby: Learn what to expect in the first days of breastfeeding your newborn.  This class will help you feel more confident with the skills needed to begin your breastfeeding experience. Many new mothers are concerned about breastfeeding after leaving the hospital. This class will also address the most common fears and challenges about breastfeeding during the first few weeks, months and beyond. (call for fee) Comfort Techniques and Tour: This 2 hour interactive class will provide you the opportunity to learn & practice hands-on techniques that can help relieve some of the discomfort of labor and encourage your baby to rotate toward the best position for birth. You and your partner will be able to try a variety of labor positions with birth balls and rebozos as well as practice breathing, relaxation, and visualization techniques. A tour of the [facility] is included with this class. $20 per registrant and support person Childbirth Class- Weekend Option: This class is a Weekend version of our Birth & Baby series. It is designed for parents who have a difficult time fitting several weeks of classes into their schedule. It covers the care of your newborn and the basics of labor and childbirth. It also includes a [place] [facility] and lunch. The class is held two consecutive days: beginning on Friday evening from 6:30 - 8:30 p.m. and the next day, Saturday from 9 a.m. - 4 p.m. (call for fee) Doren Custard Class: Interested in a waterbirth?  This informational class will help you discover whether waterbirth is the right fit for you. Education about waterbirth itself, supplies you would need and how to assemble your support team is what you can expect  from this class. Some obstetrical practices require this class in order to pursue a waterbirth. (Not all obstetrical practices offer waterbirth-check with your healthcare provider.) Register only the expectant mom, but you are encouraged to bring your partner to class! Required if planning waterbirth, no fee. Infant/Child CPR: Parents, grandparents, babysitters, and friends learn Cardio-Pulmonary Resuscitation skills for infants and children. You will also learn how to treat both conscious and unconscious choking in infants and children. This Family & Friends program does not offer certification. Register each participant individually to ensure that enough mannequins are available. (Call for fee) Grandparent Love: Expecting a grandbaby? This class is for you! Learn about the latest infant care and safety recommendations and ways to support your own child as he or she transitions into the parenting role. Taught by Registered Nurses who are childbirth instructors, but most importantly...they are grandmothers too! $10/person. Childbirth Class- Natural Childbirth: This series of 5 weekly classes is for expectant parents who want to learn and practice natural methods of coping with the process of labor and childbirth. Relaxation, breathing, massage, visualization, role of the partner, and helpful positioning are highlighted. Participants learn how to be confident in their body's ability to give birth. This class will empower and help parents make informed decisions about their own  care. Includes discussion that will help new parents transition into the immediate postpartum period. [facility] is included. We suggest taking this class between 25-32 weeks, but it's only a recommendation. $75 per registrant and one support person or $30 Medicaid. Childbirth Class- 3 week Series: This option of 3 weekly classes helps you and your labor partner prepare for childbirth. Newborn care, labor  & birth, cesarean birth, pain management, and comfort techniques are discussed and a [place] [facility] is included. The class meets at the same time, on the same day of the week for 3 consecutive weeks beginning with the starting date you choose. $60 for registrant and one support person.  Marvelous Multiples: Expecting twins, triplets, or more? This class covers the differences in labor, birth, parenting, and breastfeeding issues that face multiples' parents. NICU tour is included. Led by a Certified Childbirth Educator who is the mother of twins. No fee. Caring for Baby: This class is for expectant and adoptive parents who want to learn and practice the most up-to-date newborn care for their babies. Focus is on birth through the first six weeks of life. Topics include feeding, bathing, diapering, crying, umbilical cord care, circumcision care and safe sleep. Parents learn to recognize symptoms of illness and when to call the pediatrician. Register only the mom-to-be and your partner or support person can plan to come with you! $10 per registrant and support person Childbirth Class- online option: This online class offers you the freedom to complete a Birth and Baby series in the comfort of your own home. The flexibility of this option allows you to review sections at your own pace, at times convenient to you and your support people. It includes additional video information, animations, quizzes, and extended activities. Get organized with helpful eClass tools, checklists, and trackers. Once you register online for the class, you will receive an email within a few days to accept the invitation and begin the class when the time is right for you. The content will be available to you for 60 days. $60 for 60 days of online access for you and your support people.  Local Doulas: Natural Baby Doulas [email or web address] Tel:  [phone] [email or web address] [employer] [phone] [email or web address] The Labor Hassell Halim  (also do waterbirth tub rental) [phone] [email or web address] [facility] Birth Doula [phone] [email or web address].fi Sacred Rhythms  [phone] https://[email or web address]-[email or web address]/ [employer] (PADA) [email or web address].[email or web address] [place] Birth [facility] Baby  [email or web address] Considering Waterbirth? Guide for patients at [facility] for [employer]  Why consider waterbirth?  . Gentle birth for babies . Less pain medicine used in labor . May allow for passive descent/less pushing . May reduce perineal tears  . More mobility and instinctive maternal position changes . Increased maternal relaxation . Reduced blood pressure in labor  Is waterbirth safe? What are the risks of infection, drowning or other complications?  . Infection: o Very low risk (3.7 % for tub vs 4.8% for bed) o 7 in 8000 waterbirths with documented infection o Poorly cleaned equipment most common cause o Slightly lower group B strep transmission rate  . Drowning o Maternal:  - Very low risk   - Related to seizures or fainting o Newborn:  - Very low risk. No evidence of increased risk of respiratory problems in multiple large studies - Physiological protection from breathing under water - Avoid underwater birth if there are any fetal complications - Once baby's head is out of the water, keep it  out.  . Birth complication o Some reports of cord trauma, but risk decreased by bringing baby to surface gradually o No evidence of increased risk of shoulder dystocia. Mothers can usually change positions faster in water than in a bed, possibly aiding the maneuvers to free the shoulder.   You must attend a Doren Custard class at [facility]  3rd Wednesday of every month from 7-9pm  [employer] by calling [phone] or online at [email or web address]  Bring [place] the certificate from the class to your prenatal appointment  Meet with a midwife at 36 weeks to see if you can still plan a waterbirth and to sign the consent.   Purchase or rent the following supplies:   Water Birth Pool (Birth Pool in a Box or [place] for instance)  (Tubs start ~$125)  Single-use disposable tub liner designed for your brand of tub  New garden hose labeled "lead-free", "suitable for drinking water",  Electric drain pump to remove water (We recommend 792 gallon per hour or greater pump.)   Separate garden hose to remove the dirty water  Fish net  Bathing suit top (optional)  Long-handled mirror (optional)  Places to purchase or rent supplies  GotWebTools.is for tub purchases and supplies  Waterbirthsolutions.com for tub purchases and supplies  The Labor Ladies (www.[email or web address]) $275 for tub rental/set-up & take down/kit   [employer] (http://www.fleming.com/.htm) Information regarding doulas (labor support) who provide pool rentals  Our practice has a Birth Pool in a Box tub at the hospital that you may borrow on a first-come-first-served basis. It is your responsibility to to set up, clean and break down the tub. We cannot guarantee the availability of this tub in advance. You are responsible for bringing all accessories listed above. If you do not have all necessary supplies you cannot have a waterbirth.    Things that would prevent you from having a waterbirth:  Premature, <37wks  Previous cesarean birth  Presence of thick meconium-stained fluid  Multiple gestation (Twins, triplets, etc.)  Uncontrolled diabetes or gestational diabetes requiring medication  Hypertension requiring medication or diagnosis of pre-eclampsia  Heavy vaginal bleeding  Non-reassuring fetal  heart rate  Active infection (MRSA, etc.). Group B Strep is NOT a contraindication for  waterbirth.  If your labor has to be induced and induction method requires continuous  monitoring of the baby's heart rate  Other risks/issues identified by your obstetrical provider  Please remember that birth is unpredictable. Under certain unforeseeable circumstances your provider may advise against giving birth in the tub. These decisions will be made on a case-by-case basis and with the safety of you and your baby as our highest priority.

## 2017-11-24 NOTE — Progress Notes (Signed)
   PRENATAL VISIT NOTE  Subjective:  Jasmin Wolfe is a 22 y.o. G2P0010 at [redacted]w[redacted]d being seen today for ongoing prenatal care.  She is currently monitored for the following issues for this low-risk pregnancy and has Supervision of normal first pregnancy, antepartum; Obesity affecting pregnancy in second trimester; Obesity, Class II, BMI 35-39.9; Encounter for other antenatal screening follow-up; Evaluate anatomy not seen on prior sonogram; and [redacted] weeks gestation of pregnancy on their problem list.  Patient reports vomiting without nausea after eating. States the most recent food that triggered a vomiting episode was a hamburger. Does not feel that prescribed Zofran helps.  Contractions: Not present. Vag. Bleeding: None.  Movement: Present. Denies leaking of fluid.   The following portions of the patient's history were reviewed and updated as appropriate: allergies, current medications, past family history, past medical history, past social history, past surgical history and problem list. Problem list updated.  Objective:   Vitals:   11/24/17 1032  BP: 105/66  Pulse: 85  Weight: 225 lb 3.2 oz (102.2 kg)    Fetal Status: Fetal Heart Rate (bpm): 146 Fundal Height: 26 cm Movement: Present     General:  Alert, oriented and cooperative. Patient is in no acute distress.  Skin: Skin is warm and dry. No rash noted.   Cardiovascular: Normal heart rate noted  Respiratory: Normal respiratory effort, no problems with respiration noted  Abdomen: Soft, gravid, appropriate for gestational age.  Pain/Pressure: Absent     Pelvic: Cervical exam deferred        Extremities: Normal range of motion.  Edema: None  Mental Status: Normal mood and affect. Normal behavior. Normal judgment and thought content.   Assessment and Plan:  Pregnancy: G2P0010 at [redacted]w[redacted]d  1. Encounter for supervision of other normal pregnancy in second trimester - Pregnancy progressing well, continue routine care - Reviewed  interventions for vomiting: small frequent meals throughout day, bland food selection, food journal to keep track of which foods seem to trigger vomiting, possible ginger supplementation and/or wrist compression bands. Reviewed that ginger is not FDA approved but a common alleviating factor. Also reviewed that compression bands are typically used first trimester for morning sickness but may be a positive influence for overall susceptibility to vomiting.  Preterm labor symptoms and general obstetric precautions including but not limited to vaginal bleeding, contractions, leaking of fluid and fetal movement were reviewed in detail with the patient. Please refer to After Visit Summary for other counseling recommendations.  Return in about 2 weeks (around 12/08/2017) for 28wk fsting 2 gtt /28 wk.  Future Appointments  Date Time Provider Department Center  12/08/2017  8:20 AM [facility]  12/08/2017 10:15 AM Judeth Horn, NP [facility]    Roxy [place], [place]  11/24/17  11:00 AM

## 2017-11-24 NOTE — Progress Notes (Signed)
Weight loss noted- states still throwing up and no real appetite.States not taking zofran because doesn't really feel nauseous, just all of a sudden will vomit. Discussed eating smaller meals and snacks.

## 2017-12-07 ENCOUNTER — Other Ambulatory Visit: Payer: Self-pay | Admitting: General Practice

## 2017-12-07 DIAGNOSIS — Z3400 Encounter for supervision of normal first pregnancy, unspecified trimester: Secondary | ICD-10-CM

## 2017-12-08 ENCOUNTER — Ambulatory Visit (INDEPENDENT_AMBULATORY_CARE_PROVIDER_SITE_OTHER): Payer: Medicaid Other | Admitting: Student

## 2017-12-08 ENCOUNTER — Other Ambulatory Visit: Payer: Medicaid Other

## 2017-12-08 ENCOUNTER — Encounter: Payer: Self-pay | Admitting: Student

## 2017-12-08 VITALS — BP 109/67 | HR 77 | Wt 230.4 lb

## 2017-12-08 DIAGNOSIS — Z23 Encounter for immunization: Secondary | ICD-10-CM

## 2017-12-08 DIAGNOSIS — IMO0002 Reserved for concepts with insufficient information to code with codable children: Secondary | ICD-10-CM

## 2017-12-08 DIAGNOSIS — Z0489 Encounter for examination and observation for other specified reasons: Secondary | ICD-10-CM

## 2017-12-08 DIAGNOSIS — Z3400 Encounter for supervision of normal first pregnancy, unspecified trimester: Secondary | ICD-10-CM

## 2017-12-08 DIAGNOSIS — Z3483 Encounter for supervision of other normal pregnancy, third trimester: Secondary | ICD-10-CM | POA: Diagnosis not present

## 2017-12-08 NOTE — Progress Notes (Signed)
   PRENATAL VISIT NOTE  Subjective:  Jasmin Wolfe is a 22 y.o. G2P0010 at 2231w0d being seen today for ongoing prenatal care.  She is currently monitored for the following issues for this low-risk pregnancy and has Supervision of normal first pregnancy, antepartum; Obesity affecting pregnancy in second trimester; Obesity, Class II, BMI 35-39.9; Encounter for other antenatal screening follow-up; and Evaluate anatomy not seen on prior sonogram on their problem list.  Patient reports no complaints.  Contractions: Not present. Vag. Bleeding: None.  Movement: Present. Denies leaking of fluid.   The following portions of the patient's history were reviewed and updated as appropriate: allergies, current medications, past family history, past medical history, past social history, past surgical history and problem list. Problem list updated.  Objective:   Vitals:   12/08/17 0836  BP: 109/67  Pulse: 77  Weight: 230 lb 6.4 oz (104.5 kg)    Fetal Status: Fetal Heart Rate (bpm): 147 Fundal Height: 27 cm Movement: Present     General:  Alert, oriented and cooperative. Patient is in no acute distress.  Skin: Skin is warm and dry. No rash noted.   Cardiovascular: Normal heart rate noted  Respiratory: Normal respiratory effort, no problems with respiration noted  Abdomen: Soft, gravid, appropriate for gestational age.  Pain/Pressure: Absent     Pelvic: Cervical exam deferred        Extremities: Normal range of motion.  Edema: None  Mental Status: Normal mood and affect. Normal behavior. Normal judgment and thought content.   Assessment and Plan:  Pregnancy: G2P0010 at 8731w0d  1. Supervision of normal first pregnancy, antepartum -doing well, no complaints -Unsure of PP contraception -- list given -Wants to go to CB classes but difficulty with work schedule --- given list & encouraged to go to [email or web address] - Tdap vaccine greater than or equal to 7yo IM - US MFM OB FOLLOW UP; Future  2.  Evaluate anatomy not seen on prior sonogram -per last U/s, needs f/u for growth & to complete anatomy -- discussed with patient  Preterm labor symptoms and general obstetric precautions including but not limited to vaginal bleeding, contractions, leaking of fluid and fetal movement were reviewed in detail with the patient. Please refer to After Visit Summary for other counseling recommendations.  Return in about 2 weeks (around 12/22/2017) for Routine OB.  Future Appointments  Date Time Provider Department Center  12/08/2017 10:15 AM Judeth HornLawrence, Elisheba Mcdonnell, [facility]  12/13/2017  3:45 PM [facility] US 2 [facility]US    Judeth HornErin Janziel Hockett, NP

## 2017-12-08 NOTE — Patient Instructions (Signed)
Contraception Choices Contraception, also called birth control, refers to methods or devices that prevent pregnancy. Hormonal methods Contraceptive implant A contraceptive implant is a thin, plastic tube that contains a hormone. It is inserted into the upper part of the arm. It can remain in place for up to 3 years. Progestin-only injections Progestin-only injections are injections of progestin, a synthetic form of the hormone progesterone. They are given every 3 months by a health care provider. Birth control pills Birth control pills are pills that contain hormones that prevent pregnancy. They must be taken once a day, preferably at the same time each day. Birth control patch The birth control patch contains hormones that prevent pregnancy. It is placed on the skin and must be changed once a week for three weeks and removed on the fourth week. A prescription is needed to use this method of contraception. Vaginal ring A vaginal ring contains hormones that prevent pregnancy. It is placed in the vagina for three weeks and removed on the fourth week. After that, the process is repeated with a new ring. A prescription is needed to use this method of contraception. Emergency contraceptive Emergency contraceptives prevent pregnancy after unprotected sex. They come in pill form and can be taken up to 5 days after sex. They work best the sooner they are taken after having sex. Most emergency contraceptives are available without a prescription. This method should not be used as your only form of birth control. Barrier methods Female condom A female condom is a thin sheath that is worn over the penis during sex. Condoms keep sperm from going inside a woman's body. They can be used with a spermicide to increase their effectiveness. They should be disposed after a single use. Female condom A female condom is a soft, loose-fitting sheath that is put into the vagina before sex. The condom keeps sperm from going  inside a woman's body. They should be disposed after a single use. Diaphragm A diaphragm is a soft, dome-shaped barrier. It is inserted into the vagina before sex, along with a spermicide. The diaphragm blocks sperm from entering the uterus, and the spermicide kills sperm. A diaphragm should be left in the vagina for 6-8 hours after sex and removed within 24 hours. A diaphragm is prescribed and fitted by a health care provider. A diaphragm should be replaced every 1-2 years, after giving birth, after gaining more than 15 lb (6.8 kg), and after pelvic surgery. Cervical cap A cervical cap is a round, soft latex or plastic cup that fits over the cervix. It is inserted into the vagina before sex, along with spermicide. It blocks sperm from entering the uterus. The cap should be left in place for 6-8 hours after sex and removed within 48 hours. A cervical cap must be prescribed and fitted by a health care provider. It should be replaced every 2 years. Sponge A sponge is a soft, circular piece of polyurethane foam with spermicide on it. The sponge helps block sperm from entering the uterus, and the spermicide kills sperm. To use it, you make it wet and then insert it into the vagina. It should be inserted before sex, left in for at least 6 hours after sex, and removed and thrown away within 30 hours. Spermicides Spermicides are chemicals that kill or block sperm from entering the cervix and uterus. They can come as a cream, jelly, suppository, foam, or tablet. A spermicide should be inserted into the vagina with an applicator at least 10-15 minutes before   sex to allow time for it to work. The process must be repeated every time you have sex. Spermicides do not require a prescription. Intrauterine contraception Intrauterine device (IUD) An IUD is a T-shaped device that is put in a woman's uterus. There are two types:  Hormone IUD.This type contains progestin, a synthetic form of the hormone progesterone. This  type can stay in place for 3-5 years.  Copper IUD.This type is wrapped in copper wire. It can stay in place for 10 years.  Permanent methods of contraception Female tubal ligation In this method, a woman's fallopian tubes are sealed, tied, or blocked during surgery to prevent eggs from traveling to the uterus. Hysteroscopic sterilization In this method, a small, flexible insert is placed into each fallopian tube. The inserts cause scar tissue to form in the fallopian tubes and block them, so sperm cannot reach an egg. The procedure takes about 3 months to be effective. Another form of birth control must be used during those 3 months. Female sterilization This is a procedure to tie off the tubes that carry sperm (vasectomy). After the procedure, the man can still ejaculate fluid (semen). Natural planning methods Natural family planning In this method, a couple does not have sex on days when the woman could become pregnant. Calendar method This means keeping track of the length of each menstrual cycle, identifying the days when pregnancy can happen, and not having sex on those days. Ovulation method In this method, a couple avoids sex during ovulation. Symptothermal method This method involves not having sex during ovulation. The woman typically checks for ovulation by watching changes in her temperature and in the consistency of cervical mucus. Post-ovulation method In this method, a couple waits to have sex until after ovulation. Summary  Contraception, also called birth control, means methods or devices that prevent pregnancy.  Hormonal methods of contraception include implants, injections, pills, patches, vaginal rings, and emergency contraceptives.  Barrier methods of contraception can include female condoms, female condoms, diaphragms, cervical caps, sponges, and spermicides.  There are two types of IUDs (intrauterine devices). An IUD can be put in a woman's uterus to prevent pregnancy  for 3-5 years.  Permanent sterilization can be done through a procedure for males, females, or both.  Natural family planning methods involve not having sex on days when the woman could become pregnant. This information is not intended to replace advice given to you by your health care provider. Make sure you discuss any questions you have with your health care provider. Document Released: 06/22/2005 Document Revised: 07/25/2016 Document Reviewed: 07/25/2016 Elsevier Interactive Patient Education  2018 Elsevier Inc.  

## 2017-12-09 ENCOUNTER — Other Ambulatory Visit: Payer: Self-pay | Admitting: Student

## 2017-12-09 ENCOUNTER — Encounter (INDEPENDENT_AMBULATORY_CARE_PROVIDER_SITE_OTHER): Payer: Self-pay

## 2017-12-09 DIAGNOSIS — O99013 Anemia complicating pregnancy, third trimester: Secondary | ICD-10-CM

## 2017-12-09 LAB — GLUCOSE TOLERANCE, 2 HOURS W/ 1HR
GLUCOSE, 2 HOUR: 87 mg/dL (ref 65–152)
Glucose, 1 hour: 103 mg/dL (ref 65–179)
Glucose, Fasting: 83 mg/dL (ref 65–91)

## 2017-12-09 LAB — CBC
HEMATOCRIT: 32.7 % — AB (ref 34.0–46.6)
Hemoglobin: 10.5 g/dL — ABNORMAL LOW (ref 11.1–15.9)
MCH: 29.4 pg (ref 26.6–33.0)
MCHC: 32.1 g/dL (ref 31.5–35.7)
MCV: 92 fL (ref 79–97)
PLATELETS: 261 10*3/uL (ref 150–450)
RBC: 3.57 x10E6/uL — ABNORMAL LOW (ref 3.77–5.28)
RDW: 13.5 % (ref 12.3–15.4)
WBC: 7.7 10*3/uL (ref 3.4–10.8)

## 2017-12-09 LAB — RPR: RPR Ser Ql: NONREACTIVE

## 2017-12-09 LAB — HIV ANTIBODY (ROUTINE TESTING W REFLEX): HIV Screen 4th Generation wRfx: NONREACTIVE

## 2017-12-09 MED ORDER — FERROUS SULFATE 325 (65 FE) MG PO TABS: 325.0000 mg | ORAL_TABLET | Freq: Every day | ORAL | 2 refills | Status: DC

## 2017-12-13 ENCOUNTER — Ambulatory Visit (HOSPITAL_COMMUNITY)
Admission: RE | Admit: 2017-12-13 | Discharge: 2017-12-13 | Disposition: A | Discharge status: Home / Self Care | Payer: Medicaid Other | Source: Ambulatory Visit | Attending: Student | Admitting: Student

## 2017-12-13 ENCOUNTER — Other Ambulatory Visit: Payer: Self-pay | Admitting: Student

## 2017-12-13 DIAGNOSIS — Z0489 Encounter for examination and observation for other specified reasons: Secondary | ICD-10-CM

## 2017-12-13 DIAGNOSIS — O99213 Obesity complicating pregnancy, third trimester: Secondary | ICD-10-CM

## 2017-12-13 DIAGNOSIS — Z3A28 28 weeks gestation of pregnancy: Secondary | ICD-10-CM | POA: Diagnosis not present

## 2017-12-13 DIAGNOSIS — Z3400 Encounter for supervision of normal first pregnancy, unspecified trimester: Secondary | ICD-10-CM

## 2017-12-13 DIAGNOSIS — Z362 Encounter for other antenatal screening follow-up: Secondary | ICD-10-CM | POA: Diagnosis present

## 2017-12-13 DIAGNOSIS — IMO0002 Reserved for concepts with insufficient information to code with codable children: Secondary | ICD-10-CM

## 2017-12-17 ENCOUNTER — Encounter: Payer: Self-pay | Admitting: Student

## 2017-12-22 ENCOUNTER — Ambulatory Visit (INDEPENDENT_AMBULATORY_CARE_PROVIDER_SITE_OTHER): Payer: Medicaid Other | Admitting: Nurse Practitioner

## 2017-12-22 VITALS — BP 107/64 | HR 78 | Wt 227.0 lb

## 2017-12-22 DIAGNOSIS — Z3400 Encounter for supervision of normal first pregnancy, unspecified trimester: Secondary | ICD-10-CM

## 2017-12-22 DIAGNOSIS — Z3403 Encounter for supervision of normal first pregnancy, third trimester: Secondary | ICD-10-CM

## 2017-12-22 DIAGNOSIS — O99213 Obesity complicating pregnancy, third trimester: Secondary | ICD-10-CM

## 2017-12-22 NOTE — Patient Instructions (Addendum)
Sign up for [facility] at [address] [employer] up for Childbirth classes - [email or web address]  BENEFITS OF BREASTFEEDING Many women wonder if they should breastfeed. Research shows that breast milk contains the perfect balance of vitamins, protein and fat that your baby needs to grow. It also contains antibodies that help your baby's immune system to fight off viruses and bacteria and can reduce the risk of sudden infant death syndrome (SIDS). In addition, the colostrum (a fluid secreted from the breast in the first few days after delivery) helps your newborn's digestive system to grow and function well. Breast milk is easier to digest than formula. Also, if your baby is born preterm, breast milk can help to reduce both short- and long-term health problems. BENEFITS OF BREASTFEEDING FOR MOM . Breastfeeding causes a hormone to be released that helps the uterus to contract and return to its normal size more quickly. . It aids in postpartum weight loss, reduces risk of breast and ovarian cancer, heart disease and rheumatoid arthritis. . It decreases the amount of bleeding after the baby is born. benefits of breastfeeding for baby . Provides comfort and nutrition . Protects baby against - Obesity - Diabetes - Asthma - Childhood cancers - Heart disease - Ear infections - Diarrhea - Pneumonia - Stomach problems - Serious allergies - Skin rashes . Promotes growth and development . Reduces the risk of baby having Sudden Infant Death Syndrome (SIDS) only breastmilk for the first 6 months . Protects baby against diseases/allergies . It's the perfect amount for tiny bellies . It restores baby's energy . Provides the best nutrition for baby . Giving water or formula can make baby more likely to get sick, decrease Mom's milk supply, make baby less content with breastfeeding Skin to Skin After delivery, the staff will place your baby on your chest. This helps with the following: . Regulates baby's  temperature, breathing, heart rate and blood sugar . Increases Mom's milk supply . Promotes bonding . Keeps baby and Mom calm and decreases baby's crying Rooming In Your baby will stay in your room with you for the entire time you are in the hospital. This helps with the following: . Allows Mom to learn baby's feeding cues - Fluttering eyes - Sucking on tongue or hand - Rooting (opens mouth and turns head) - Nuzzling into the breast - Bringing hand to mouth . Allows breastfeeding on demand (when your baby is ready) . Helps baby to be calm and content . Ensures a good milk supply . Prevents complications with breastfeeding . Allows parents to learn to care for baby . Allows you to request assistance with breastfeeding Importance of a good latch . Increases milk transfer to baby - baby gets enough milk . Ensures you have enough milk for your baby . Decreases nipple soreness . Don't use pacifiers and bottles - these cause baby to suck differently than breastfeeding . Promotes continuation of breastfeeding Risks of Formula Supplementation with Breastfeeding Giving your infant formula in addition to your breast-milk EXCEPT when medically necessary can lead to: Marland Kitchen Decreases your milk supply  . Loss of confidence in yourself for providing baby's nutrition  . Engorgement and possibly mastitis  . Asthma & allergies in the baby BREASTFEEDING FAQS How long should I breastfeed my baby? It is recommended that you provide your baby with breast milk only for the first 6 months and then continue for the first year and longer as desired. During the first few weeks after birth, your baby  will need to feed 8-12 times every 24 hours, or every 2-3 hours. They will likely feed for 15-30 minutes. How can I help my baby begin breastfeeding? Babies are born with an instinct to breastfeed. A healthy baby can begin breastfeeding right away without specific help. At the hospital, a nurse (or lactation  consultant) will help you begin the process and will give you tips on good positioning. It may be helpful to take a breastfeeding class before you deliver in order to know what to expect. How can I help my baby latch on? In order to assist your baby in latching-on, cup your breast in your hand and stroke your baby's lower lip with your nipple to stimulate your baby's rooting reflex. Your baby will look like he or she is yawning, at which point you should bring the baby towards your breast, while aiming the nipple at the roof of his or her mouth. Remember to bring the baby towards you and not your breast towards the baby. How can I tell if my baby is latched-on? Your baby will have all of your nipple and part of the dark area around the nipple in his or her mouth and your baby's nose will be touching your breast. You should see or hear the baby swallowing. If the baby is not latched-on properly, start the process over. To remove the suction, insert a clean finger between your breast and the baby's mouth. Should I switch breasts during feeding? After feeding on one side, switch the baby to your other breast. If he or she does not continue feeding - that is OK. Your baby will not necessarily need to feed from both breasts in a single feeding. On the next feeding, start with the other breast for efficiency and comfort. How can I tell if my baby is hungry? When your baby is hungry, they will nuzzle against your breast, make sucking noises and tongue motions and may put their hands near their mouth. Crying is a late sign of hunger, so you should not wait until this point. When they have received enough milk, they will unlatch from the breast. Is it okay to use a pacifier? Until your baby gets the hang of breastfeeding, experts recommend limiting pacifier usage. If you have questions about this, please contact your pediatrician. What can I do to ensure proper nutrition while breastfeeding? . Make sure that you  support your own health and your baby's by eating a healthy, well-balanced diet . Your provider may recommend that you continue to take your prenatal vitamin . Drink plenty of fluids. It is a good rule to drink one glass of water before or after feeding . Alcohol will remain in the breast milk for as long as it will remain in the blood stream. If you choose to have a drink, it is recommended that you wait at least 2 hours before feeding . Moderate amounts of caffeine are OK . Some over-the-counter or prescription medications are not recommended during breastfeeding. Check with your provider if you have questions What types of birth control methods are safe while breastfeeding? Progestin-only methods, including a daily pill, an IUD, the implant and the injection are safe while breastfeeding. Methods that contain estrogen (such as combination birth control pills, the vaginal ring and the patch) should not be used during the first month of breastfeeding as these can decrease your milk supply.  AREA PEDIATRIC/FAMILY PRACTICE PHYSICIANS  [facility] [address] [place],  [place]  [address] Phone - [phone]   Fax - [phone]  [facility] [address] [place], [place] [address] Phone - [phone]   Fax - [phone]  JACK AMOS [address] [place], [place]  [address] Phone - [phone]   Fax - [phone]  [facility] [address] [place], [place]  [address] Phone - [phone]   Fax - [phone]  [facility] [place] [address] [place], [place]  [address] Phone - [phone]   Fax - [phone]  [facility] [address] [place], [place]  [address] Phone - [phone]   Fax - [phone]  [facility]  [address], [address] [place], [place]  [address] Phone - [phone]   Fax - [phone]  [facility] FAMILY MEDICINE AT  [facility] [address] [place], [address] [place], [place]  [address] Phone - [phone]   Fax - [phone]  [facility] FAMILY MEDICINE AT [facility] [address] [place], [place]  [address] Phone - [phone]   Fax - [phone] [facility] AT [place] [address] [place], [place]  [address] Phone - [phone]   Fax - [phone]  [facility] AT [facility] [address] [place], [place]  [address] Phone - [phone]   Fax - [phone]  [facility] [place] [address] [place], [place]  [address] Phone - [phone]   Fax - [phone]  [facility] [place] [address] [place], [place]  [address] Phone - [phone]   Fax - [phone]  [facility] [address] [place], [place]  [address] Phone - [phone]  [facility] [address] [place], [place]  [address] Phone - [phone]   Fax - [phone]  [facility] [address] [place], [place]  [address] Phone - [phone]   Fax - [phone]  [facility] [address] [place], [place]  [address] Phone - [phone]   Fax - [phone]  [facility] [address] [place], [place]  [address] Phone - [phone]   Fax - [phone]   [facility] [address] [place] [address] [place], [place]  [address] Phone - [phone]   Fax - [phone]  [facility] [address] [place], [place]  [address] Phone - [phone]   Fax - [phone]  DAVID RUBIN [address] [place], [place]  [address] Phone - [phone]   Fax - [phone]  [facility] [address] [place], [place]  [address] Phone - [phone]   Fax - [phone]  [facility] Alita Chyle [address] [place], [place]  [address] Phone - [phone]   Fax -  [phone] Gerarda Fraction [address] [place], [place]  [address] Phone - [phone]   Fax - [phone]  [facility] [address] [place], [place]  [address] Phone - [phone]   Fax - [phone]  [facility] - [place] [address] [place], [place]  [address] Phone - [phone]   Fax - [phone]  [facility] [place] Wyvonne Lenz MD [address] [place] [address] Phone [phone]  Fax [phone]

## 2017-12-22 NOTE — Progress Notes (Signed)
    Subjective:  Jasmin Wolfe is a 22 y.o. G2P0010 at 7778w0d being seen today for ongoing prenatal care.  She is currently monitored for the following issues for this low-risk pregnancy and has Supervision of normal first pregnancy, antepartum; Obesity affecting pregnancy in second trimester; Obesity, Class II, BMI 35-39.9; Encounter for other antenatal screening follow-up; and Obesity affecting pregnancy in third trimester on their problem list.  Patient reports no complaints.  Contractions: Not present. Vag. Bleeding: None.  Movement: Present. Denies leaking of fluid.   The following portions of the patient's history were reviewed and updated as appropriate: allergies, current medications, past family history, past medical history, past social history, past surgical history and problem list. Problem list updated.  Objective:   Vitals:   12/22/17 1123  BP: 107/64  Pulse: 78  Weight: 227 lb (103 kg)    Fetal Status: Fetal Heart Rate (bpm): 142 Fundal Height: 30 cm Movement: Present     General:  Alert, oriented and cooperative. Patient is in no acute distress.  Skin: Skin is warm and dry. No rash noted.   Cardiovascular: Normal heart rate noted  Respiratory: Normal respiratory effort, no problems with respiration noted  Abdomen: Soft, gravid, appropriate for gestational age. Pain/Pressure: Absent     Pelvic:  Cervical exam deferred        Extremities: Normal range of motion.  Edema: None  Mental Status: Normal mood and affect. Normal behavior. Normal judgment and thought content.    Assessment and Plan:  Pregnancy: G2P0010 at 7078w0d  1. Supervision of normal first pregnancy, antepartum Ad[facility] and for childbirth classes  2. Obesity affecting pregnancy in third trimester   Preterm labor symptoms and general obstetric precautions including but not limited to vaginal bleeding, contractions, leaking of fluid and fetal movement  were reviewed in detail with the patient. Please refer to After Visit Summary for other counseling recommendations.  Return in about 2 weeks (around 01/05/2018).  Nolene BernheimERRI Alverta Caccamo, RN, MSN, NP-BC Nurse Practitioner, [facility], [facility] 12/22/2017 10:40 PM

## 2018-01-05 ENCOUNTER — Ambulatory Visit (INDEPENDENT_AMBULATORY_CARE_PROVIDER_SITE_OTHER): Payer: Medicaid Other | Admitting: Advanced Practice Midwife

## 2018-01-05 VITALS — BP 106/71 | HR 79 | Wt 231.1 lb

## 2018-01-05 DIAGNOSIS — Z3400 Encounter for supervision of normal first pregnancy, unspecified trimester: Secondary | ICD-10-CM

## 2018-01-05 NOTE — Patient Instructions (Signed)
Third Trimester of Pregnancy The third trimester is from week 28 through week 40 (months 7 through 9). The third trimester is a time when the unborn baby (fetus) is growing rapidly. At the end of the ninth month, the fetus is about 20 inches in length and weighs 6-10 pounds. Body changes during your third trimester Your body will continue to go through many changes during pregnancy. The changes vary from woman to woman. During the third trimester:  Your weight will continue to increase. You can expect to gain 25-35 pounds (11-16 kg) by the end of the pregnancy.  You may begin to get stretch marks on your hips, abdomen, and breasts.  You may urinate more often because the fetus is moving lower into your pelvis and pressing on your bladder.  You may develop or continue to have heartburn. This is caused by increased hormones that slow down muscles in the digestive tract.  You may develop or continue to have constipation because increased hormones slow digestion and cause the muscles that push waste through your intestines to relax.  You may develop hemorrhoids. These are swollen veins (varicose veins) in the rectum that can itch or be painful.  You may develop swollen, bulging veins (varicose veins) in your legs.  You may have increased body aches in the pelvis, back, or thighs. This is due to weight gain and increased hormones that are relaxing your joints.  You may have changes in your hair. These can include thickening of your hair, rapid growth, and changes in texture. Some women also have hair loss during or after pregnancy, or hair that feels dry or thin. Your hair will most likely return to normal after your baby is born.  Your breasts will continue to grow and they will continue to become tender. A yellow fluid (colostrum) may leak from your breasts. This is the first milk you are producing for your baby.  Your belly button may stick out.  You may notice more swelling in your hands,  face, or ankles.  You may have increased tingling or numbness in your hands, arms, and legs. The skin on your belly may also feel numb.  You may feel short of breath because of your expanding uterus.  You may have more problems sleeping. This can be caused by the size of your belly, increased need to urinate, and an increase in your body's metabolism.  You may notice the fetus "dropping," or moving lower in your abdomen (lightening).  You may have increased vaginal discharge.  You may notice your joints feel loose and you may have pain around your pelvic bone.  What to expect at prenatal visits You will have prenatal exams every 2 weeks until week 36. Then you will have weekly prenatal exams. During a routine prenatal visit:  You will be weighed to make sure you and the baby are growing normally.  Your blood pressure will be taken.  Your abdomen will be measured to track your baby's growth.  The fetal heartbeat will be listened to.  Any test results from the previous visit will be discussed.  You may have a cervical check near your due date to see if your cervix has softened or thinned (effaced).  You will be tested for Group B streptococcus. This happens between 35 and 37 weeks.  Your health care provider may ask you:  What your birth plan is.  How you are feeling.  If you are feeling the baby move.  If you have had   any abnormal symptoms, such as leaking fluid, bleeding, severe headaches, or abdominal cramping.  If you are using any tobacco products, including cigarettes, chewing tobacco, and electronic cigarettes.  If you have any questions.  Other tests or screenings that may be performed during your third trimester include:  Blood tests that check for low iron levels (anemia).  Fetal testing to check the health, activity level, and growth of the fetus. Testing is done if you have certain medical conditions or if there are problems during the  pregnancy.  Nonstress test (NST). This test checks the health of your baby to make sure there are no signs of problems, such as the baby not getting enough oxygen. During this test, a belt is placed around your belly. The baby is made to move, and its heart rate is monitored during movement.  What is false labor? False labor is a condition in which you feel small, irregular tightenings of the muscles in the womb (contractions) that usually go away with rest, changing position, or drinking water. These are called Braxton Hicks contractions. Contractions may last for hours, days, or even weeks before true labor sets in. If contractions come at regular intervals, become more frequent, increase in intensity, or become painful, you should see your health care provider. What are the signs of labor?  Abdominal cramps.  Regular contractions that start at 10 minutes apart and become stronger and more frequent with time.  Contractions that start on the top of the uterus and spread down to the lower abdomen and back.  Increased pelvic pressure and dull back pain.  A watery or bloody mucus discharge that comes from the vagina.  Leaking of amniotic fluid. This is also known as your "water breaking." It could be a slow trickle or a gush. Let your health care provider know if it has a color or strange odor. If you have any of these signs, call your health care provider right away, even if it is before your due date. Follow these instructions at home: Medicines  Follow your health care provider's instructions regarding medicine use. Specific medicines may be either safe or unsafe to take during pregnancy.  Take a prenatal vitamin that contains at least 600 micrograms (mcg) of folic acid.  If you develop constipation, try taking a stool softener if your health care provider approves. Eating and drinking  Eat a balanced diet that includes fresh fruits and vegetables, whole grains, good sources of protein  such as meat, eggs, or tofu, and low-fat dairy. Your health care provider will help you determine the amount of weight gain that is right for you.  Avoid raw meat and uncooked cheese. These carry germs that can cause birth defects in the baby.  If you have low calcium intake from food, talk to your health care provider about whether you should take a daily calcium supplement.  Eat four or five small meals rather than three large meals a day.  Limit foods that are high in fat and processed sugars, such as fried and sweet foods.  To prevent constipation: ? Drink enough fluid to keep your urine clear or pale yellow. ? Eat foods that are high in fiber, such as fresh fruits and vegetables, whole grains, and beans. Activity  Exercise only as directed by your health care provider. Most women can continue their usual exercise routine during pregnancy. Try to exercise for 30 minutes at least 5 days a week. Stop exercising if you experience uterine contractions.  Avoid heavy   lifting.  Do not exercise in extreme heat or humidity, or at high altitudes.  Wear low-heel, comfortable shoes.  Practice good posture.  You may continue to have sex unless your health care provider tells you otherwise. Relieving pain and discomfort  Take frequent breaks and rest with your legs elevated if you have leg cramps or low back pain.  Take warm sitz baths to soothe any pain or discomfort caused by hemorrhoids. Use hemorrhoid cream if your health care provider approves.  Wear a good support bra to prevent discomfort from breast tenderness.  If you develop varicose veins: ? Wear support pantyhose or compression stockings as told by your healthcare provider. ? Elevate your feet for 15 minutes, 3-4 times a day. Prenatal care  Write down your questions. Take them to your prenatal visits.  Keep all your prenatal visits as told by your health care provider. This is important. Safety  Wear your seat belt at  all times when driving.  Make a list of emergency phone numbers, including numbers for family, friends, the hospital, and police and fire departments. General instructions  Avoid cat litter boxes and soil used by cats. These carry germs that can cause birth defects in the baby. If you have a cat, ask someone to clean the litter box for you.  Do not travel far distances unless it is absolutely necessary and only with the approval of your health care provider.  Do not use hot tubs, steam rooms, or saunas.  Do not drink alcohol.  Do not use any products that contain nicotine or tobacco, such as cigarettes and e-cigarettes. If you need help quitting, ask your health care provider.  Do not use any medicinal herbs or unprescribed drugs. These chemicals affect the formation and growth of the baby.  Do not douche or use tampons or scented sanitary pads.  Do not cross your legs for long periods of time.  To prepare for the arrival of your baby: ? Take prenatal classes to understand, practice, and ask questions about labor and delivery. ? Make a trial run to the hospital. ? Visit the hospital and tour the maternity area. ? Arrange for maternity or paternity leave through employers. ? Arrange for family and friends to take care of pets while you are in the hospital. ? Purchase a rear-facing car seat and make sure you know how to install it in your car. ? Pack your hospital bag. ? Prepare the baby's nursery. Make sure to remove all pillows and stuffed animals from the baby's crib to prevent suffocation.  Visit your dentist if you have not gone during your pregnancy. Use a soft toothbrush to brush your teeth and be gentle when you floss. Contact a health care provider if:  You are unsure if you are in labor or if your water has broken.  You become dizzy.  You have mild pelvic cramps, pelvic pressure, or nagging pain in your abdominal area.  You have lower back pain.  You have persistent  nausea, vomiting, or diarrhea.  You have an unusual or bad smelling vaginal discharge.  You have pain when you urinate. Get help right away if:  Your water breaks before 37 weeks.  You have regular contractions less than 5 minutes apart before 37 weeks.  You have a fever.  You are leaking fluid from your vagina.  You have spotting or bleeding from your vagina.  You have severe abdominal pain or cramping.  You have rapid weight loss or weight gain.    You have shortness of breath with chest pain.  You notice sudden or extreme swelling of your face, hands, ankles, feet, or legs.  Your baby makes fewer than 10 movements in 2 hours.  You have severe headaches that do not go away when you take medicine.  You have vision changes. Summary  The third trimester is from week 28 through week 40, months 7 through 9. The third trimester is a time when the unborn baby (fetus) is growing rapidly.  During the third trimester, your discomfort may increase as you and your baby continue to gain weight. You may have abdominal, leg, and back pain, sleeping problems, and an increased need to urinate.  During the third trimester your breasts will keep growing and they will continue to become tender. A yellow fluid (colostrum) may leak from your breasts. This is the first milk you are producing for your baby.  False labor is a condition in which you feel small, irregular tightenings of the muscles in the womb (contractions) that eventually go away. These are called Braxton Hicks contractions. Contractions may last for hours, days, or even weeks before true labor sets in.  Signs of labor can include: abdominal cramps; regular contractions that start at 10 minutes apart and become stronger and more frequent with time; watery or bloody mucus discharge that comes from the vagina; increased pelvic pressure and dull back pain; and leaking of amniotic fluid. This information is not intended to replace advice  given to you by your health care provider. Make sure you discuss any questions you have with your health care provider. Document Released: 06/16/2001 Document Revised: 11/28/2015 Document Reviewed: 08/23/2012 Elsevier Interactive Patient Education  2017 [employer]  

## 2018-01-05 NOTE — Progress Notes (Signed)
   PRENATAL VISIT NOTE  Subjective:  Jasmin Wolfe is a 22 y.o. G2P0010 at 8241w0d being seen today for ongoing prenatal care.  She is currently monitored for the following issues for this low-risk pregnancy and has Supervision of normal first pregnancy, antepartum; Obesity affecting pregnancy in second trimester; Obesity, Class II, BMI 35-39.9; Encounter for other antenatal screening follow-up; and Obesity affecting pregnancy in third trimester on their problem list.  Patient reports no complaints.  Contractions: Not present. Vag. Bleeding: None.  Movement: Present. Denies leaking of fluid.   The following portions of the patient's history were reviewed and updated as appropriate: allergies, current medications, past family history, past medical history, past social history, past surgical history and problem list. Problem list updated.  Objective:   Vitals:   01/05/18 1616  BP: 106/71  Pulse: 79  Weight: 231 lb 1.6 oz (104.8 kg)    Fetal Status: Fetal Heart Rate (bpm): 141   Movement: Present     General:  Alert, oriented and cooperative. Patient is in no acute distress.  Skin: Skin is warm and dry. No rash noted.   Cardiovascular: Normal heart rate noted  Respiratory: Normal respiratory effort, no problems with respiration noted  Abdomen: Soft, gravid, appropriate for gestational age.  Pain/Pressure: Present     Pelvic: Cervical exam deferred        Extremities: Normal range of motion.  Edema: None  Mental Status: Normal mood and affect. Normal behavior. Normal judgment and thought content.   Assessment and Plan:  Pregnancy: G2P0010 at 4041w0d  1. Supervision of normal first pregnancy, antepartum --Anticipatory guidance about next visits/weeks of pregnancy given.  Preterm labor symptoms and general obstetric precautions including but not limited to vaginal bleeding, contractions, leaking of fluid and fetal movement were reviewed in detail with the patient. Please refer to After  Visit Summary for other counseling recommendations.  Return in about 2 weeks (around 01/19/2018).  Future Appointments  Date Time Provider Department Center  01/19/2018  8:55 AM Marny LowensteinWenzel, Julie N, PA-C [facility]  02/01/2018  8:15 AM Marny LowensteinWenzel, Julie N, PA-C [facility]    Sharen CounterLisa Leftwich-Kirby, CNM

## 2018-01-19 ENCOUNTER — Ambulatory Visit (INDEPENDENT_AMBULATORY_CARE_PROVIDER_SITE_OTHER): Payer: Medicaid Other | Admitting: Medical

## 2018-01-19 VITALS — BP 104/66 | HR 78 | Wt 233.0 lb

## 2018-01-19 DIAGNOSIS — Z3400 Encounter for supervision of normal first pregnancy, unspecified trimester: Secondary | ICD-10-CM

## 2018-01-19 DIAGNOSIS — E669 Obesity, unspecified: Secondary | ICD-10-CM

## 2018-01-19 DIAGNOSIS — O99212 Obesity complicating pregnancy, second trimester: Secondary | ICD-10-CM

## 2018-01-19 NOTE — Progress Notes (Signed)
Pt states is having a lot of Heartburn & OTC Tums is not helping.

## 2018-01-19 NOTE — Progress Notes (Signed)
   PRENATAL VISIT NOTE  Subjective:  Jasmin Wolfe is a 22 y.o. G2P0010 at 3261w0d being seen today for ongoing prenatal care.  She is currently monitored for the following issues for this low-risk pregnancy and has Supervision of normal first pregnancy, antepartum; Obesity affecting pregnancy in second trimester; Obesity, Class II, BMI 35-39.9; and Obesity affecting pregnancy in third trimester on their problem list.  Patient reports no complaints.  Contractions: Not present. Vag. Bleeding: None.  Movement: Present. Denies leaking of fluid.   The following portions of the patient's history were reviewed and updated as appropriate: allergies, current medications, past family history, past medical history, past social history, past surgical history and problem list. Problem list updated.  Objective:   Vitals:   01/19/18 0932  BP: 104/66  Pulse: 78  Weight: 233 lb (105.7 kg)    Fetal Status: Fetal Heart Rate (bpm): 133 Fundal Height: 33 cm Movement: Present     General:  Alert, oriented and cooperative. Patient is in no acute distress.  Skin: Skin is warm and dry. No rash noted.   Cardiovascular: Normal heart rate noted  Respiratory: Normal respiratory effort, no problems with respiration noted  Abdomen: Soft, gravid, appropriate for gestational age.  Pain/Pressure: Absent     Pelvic: Cervical exam deferred        Extremities: Normal range of motion.  Edema: None  Mental Status: Normal mood and affect. Normal behavior. Normal judgment and thought content.   Assessment and Plan:  Pregnancy: G2P0010 at 8861w0d  1. Supervision of normal first pregnancy, antepartum - Doing well, no complaints - Advised of need for GC/Chlamydia and GBS at next visit   2. Obesity affecting pregnancy in second trimester - US MFM OB FOLLOW UP; scheduled 7/25 as recommended by MFM for growth due to obesity, last EFW 12/13/17 41%tile  Preterm labor symptoms and general obstetric precautions including but not  limited to vaginal bleeding, contractions, leaking of fluid and fetal movement were reviewed in detail with the patient. Please refer to After Visit Summary for other counseling recommendations.  Return in about 2 weeks (around 02/02/2018) for LOB.  Future Appointments  Date Time Provider Department Center  01/27/2018 10:45 AM [facility] US 2 [facility]  02/01/2018  8:15 AM Marny LowensteinWenzel, Julie N, PA-C [facility]    Vonzella NippleJulie Wenzel, PA-C

## 2018-01-19 NOTE — Patient Instructions (Signed)
Research childbirth classes and hospital preregistration at [email or web address]  Fetal Movement Counts Patient Name: ________________________________________________ Patient Due Date: ____________________ What is a fetal movement count? A fetal movement count is the number of times that you feel your baby move during a certain amount of time. This may also be called a fetal kick count. A fetal movement count is recommended for every pregnant woman. You may be asked to start counting fetal movements as early as week 28 of your pregnancy. Pay attention to when your baby is most active. You may notice your baby's sleep and wake cycles. You may also notice things that make your baby move more. You should do a fetal movement count:  When your baby is normally most active.  At the same time each day.  A good time to count movements is while you are resting, after having something to eat and drink. How do I count fetal movements? 1. Find a quiet, comfortable area. Sit, or lie down on your side. 2. Write down the date, the start time and stop time, and the number of movements that you felt between those two times. Take this information with you to your health care visits. 3. For 2 hours, count kicks, flutters, swishes, rolls, and jabs. You should feel at least 10 movements during 2 hours. 4. You may stop counting after you have felt 10 movements. 5. If you do not feel 10 movements in 2 hours, have something to eat and drink. Then, keep resting and counting for 1 hour. If you feel at least 4 movements during that hour, you may stop counting. Contact a health care provider if:  You feel fewer than 4 movements in 2 hours.  Your baby is not moving like he or she usually does. Date: ____________ Start time: ____________ Stop time: ____________ Movements: ____________ Date: ____________ Start time: ____________ Stop time: ____________ Movements: ____________ Date: ____________ Start time: ____________  Stop time: ____________ Movements: ____________ Date: ____________ Start time: ____________ Stop time: ____________ Movements: ____________ Date: ____________ Start time: ____________ Stop time: ____________ Movements: ____________ Date: ____________ Start time: ____________ Stop time: ____________ Movements: ____________ Date: ____________ Start time: ____________ Stop time: ____________ Movements: ____________ Date: ____________ Start time: ____________ Stop time: ____________ Movements: ____________ Date: ____________ Start time: ____________ Stop time: ____________ Movements: ____________ This information is not intended to replace advice given to you by your health care provider. Make sure you discuss any questions you have with your health care provider. Document Released: 07/22/2006 Document Revised: 02/19/2016 Document Reviewed: 08/01/2015 Elsevier Interactive Patient Education  2018 [employer]  Braxton Hicks Contractions Contractions of the uterus can occur throughout pregnancy, but they are not always a sign that you are in labor. You may have practice contractions called Braxton Hicks contractions. These false labor contractions are sometimes confused with true labor. What are Braxton Hicks contractions? Braxton Hicks contractions are tightening movements that occur in the muscles of the uterus before labor. Unlike true labor contractions, these contractions do not result in opening (dilation) and thinning of the cervix. Toward the end of pregnancy (32-34 weeks), Braxton Hicks contractions can happen more often and may become stronger. These contractions are sometimes difficult to tell apart from true labor because they can be very uncomfortable. You should not feel embarrassed if you go to the hospital with false labor. Sometimes, the only way to tell if you are in true labor is for your health care provider to look for changes in the cervix. The health care provider will   do a physical  exam and may monitor your contractions. If you are not in true labor, the exam should show that your cervix is not dilating and your water has not broken. If there are other health problems associated with your pregnancy, it is completely safe for you to be sent home with false labor. You may continue to have Braxton Hicks contractions until you go into true labor. How to tell the difference between true labor and false labor True labor  Contractions last 30-70 seconds.  Contractions become very regular.  Discomfort is usually felt in the top of the uterus, and it spreads to the lower abdomen and low back.  Contractions do not go away with walking.  Contractions usually become more intense and increase in frequency.  The cervix dilates and gets thinner. False labor  Contractions are usually shorter and not as strong as true labor contractions.  Contractions are usually irregular.  Contractions are often felt in the front of the lower abdomen and in the groin.  Contractions may go away when you walk around or change positions while lying down.  Contractions get weaker and are shorter-lasting as time goes on.  The cervix usually does not dilate or become thin. Follow these instructions at home:  Take over-the-counter and prescription medicines only as told by your health care provider.  Keep up with your usual exercises and follow other instructions from your health care provider.  Eat and drink lightly if you think you are going into labor.  If Braxton Hicks contractions are making you uncomfortable: ? Change your position from lying down or resting to walking, or change from walking to resting. ? Sit and rest in a tub of warm water. ? Drink enough fluid to keep your urine pale yellow. Dehydration may cause these contractions. ? Do slow and deep breathing several times an hour.  Keep all follow-up prenatal visits as told by your health care provider. This is  important. Contact a health care provider if:  You have a fever.  You have continuous pain in your abdomen. Get help right away if:  Your contractions become stronger, more regular, and closer together.  You have fluid leaking or gushing from your vagina.  You pass blood-tinged mucus (bloody show).  You have bleeding from your vagina.  You have low back pain that you never had before.  You feel your baby's head pushing down and causing pelvic pressure.  Your baby is not moving inside you as much as it used to. Summary  Contractions that occur before labor are called Braxton Hicks contractions, false labor, or practice contractions.  Braxton Hicks contractions are usually shorter, weaker, farther apart, and less regular than true labor contractions. True labor contractions usually become progressively stronger and regular and they become more frequent.  Manage discomfort from Braxton Hicks contractions by changing position, resting in a warm bath, drinking plenty of water, or practicing deep breathing. This information is not intended to replace advice given to you by your health care provider. Make sure you discuss any questions you have with your health care provider. Document Released: 11/05/2016 Document Revised: 11/05/2016 Document Reviewed: 11/05/2016 Elsevier Interactive Patient Education  2018 [employer]    

## 2018-01-27 ENCOUNTER — Other Ambulatory Visit: Payer: Self-pay | Admitting: Medical

## 2018-01-27 ENCOUNTER — Ambulatory Visit (HOSPITAL_COMMUNITY)
Admission: RE | Admit: 2018-01-27 | Discharge: 2018-01-27 | Disposition: A | Discharge status: Home / Self Care | Payer: Medicaid Other | Source: Ambulatory Visit | Attending: Medical | Admitting: Medical

## 2018-01-27 DIAGNOSIS — E669 Obesity, unspecified: Secondary | ICD-10-CM | POA: Diagnosis not present

## 2018-01-27 DIAGNOSIS — Z362 Encounter for other antenatal screening follow-up: Secondary | ICD-10-CM | POA: Diagnosis not present

## 2018-01-27 DIAGNOSIS — O99213 Obesity complicating pregnancy, third trimester: Secondary | ICD-10-CM | POA: Diagnosis not present

## 2018-01-27 DIAGNOSIS — Z3A35 35 weeks gestation of pregnancy: Secondary | ICD-10-CM | POA: Diagnosis not present

## 2018-01-27 DIAGNOSIS — Z3400 Encounter for supervision of normal first pregnancy, unspecified trimester: Secondary | ICD-10-CM

## 2018-01-27 DIAGNOSIS — O99212 Obesity complicating pregnancy, second trimester: Secondary | ICD-10-CM

## 2018-02-01 ENCOUNTER — Ambulatory Visit (INDEPENDENT_AMBULATORY_CARE_PROVIDER_SITE_OTHER): Payer: Medicaid Other | Admitting: Obstetrics and Gynecology

## 2018-02-01 ENCOUNTER — Other Ambulatory Visit (HOSPITAL_COMMUNITY)
Admission: RE | Admit: 2018-02-01 | Discharge: 2018-02-01 | Disposition: A | Discharge status: Home / Self Care | Payer: Medicaid Other | Source: Ambulatory Visit | Attending: Medical | Admitting: Medical

## 2018-02-01 ENCOUNTER — Encounter: Payer: Self-pay | Admitting: Obstetrics and Gynecology

## 2018-02-01 VITALS — BP 110/69 | HR 79 | Wt 239.0 lb

## 2018-02-01 DIAGNOSIS — Z3400 Encounter for supervision of normal first pregnancy, unspecified trimester: Secondary | ICD-10-CM

## 2018-02-01 DIAGNOSIS — Z3483 Encounter for supervision of other normal pregnancy, third trimester: Secondary | ICD-10-CM | POA: Diagnosis present

## 2018-02-01 DIAGNOSIS — Z3A35 35 weeks gestation of pregnancy: Secondary | ICD-10-CM | POA: Diagnosis not present

## 2018-02-01 LAB — OB RESULTS CONSOLE GBS: GBS: NEGATIVE

## 2018-02-01 LAB — OB RESULTS CONSOLE GC/CHLAMYDIA: GC PROBE AMP, GENITAL: NEGATIVE

## 2018-02-01 NOTE — Patient Instructions (Signed)
Third Trimester of Pregnancy The third trimester is from week 28 through week 40 (months 7 through 9). The third trimester is a time when the unborn baby (fetus) is growing rapidly. At the end of the ninth month, the fetus is about 20 inches in length and weighs 6-10 pounds. Body changes during your third trimester Your body will continue to go through many changes during pregnancy. The changes vary from woman to woman. During the third trimester:  Your weight will continue to increase. You can expect to gain 25-35 pounds (11-16 kg) by the end of the pregnancy.  You may begin to get stretch marks on your hips, abdomen, and breasts.  You may urinate more often because the fetus is moving lower into your pelvis and pressing on your bladder.  You may develop or continue to have heartburn. This is caused by increased hormones that slow down muscles in the digestive tract.  You may develop or continue to have constipation because increased hormones slow digestion and cause the muscles that push waste through your intestines to relax.  You may develop hemorrhoids. These are swollen veins (varicose veins) in the rectum that can itch or be painful.  You may develop swollen, bulging veins (varicose veins) in your legs.  You may have increased body aches in the pelvis, back, or thighs. This is due to weight gain and increased hormones that are relaxing your joints.  You may have changes in your hair. These can include thickening of your hair, rapid growth, and changes in texture. Some women also have hair loss during or after pregnancy, or hair that feels dry or thin. Your hair will most likely return to normal after your baby is born.  Your breasts will continue to grow and they will continue to become tender. A yellow fluid (colostrum) may leak from your breasts. This is the first milk you are producing for your baby.  Your belly button may stick out.  You may notice more swelling in your hands,  face, or ankles.  You may have increased tingling or numbness in your hands, arms, and legs. The skin on your belly may also feel numb.  You may feel short of breath because of your expanding uterus.  You may have more problems sleeping. This can be caused by the size of your belly, increased need to urinate, and an increase in your body's metabolism.  You may notice the fetus "dropping," or moving lower in your abdomen (lightening).  You may have increased vaginal discharge.  You may notice your joints feel loose and you may have pain around your pelvic bone.  What to expect at prenatal visits You will have prenatal exams every 2 weeks until week 36. Then you will have weekly prenatal exams. During a routine prenatal visit:  You will be weighed to make sure you and the baby are growing normally.  Your blood pressure will be taken.  Your abdomen will be measured to track your baby's growth.  The fetal heartbeat will be listened to.  Any test results from the previous visit will be discussed.  You may have a cervical check near your due date to see if your cervix has softened or thinned (effaced).  You will be tested for Group B streptococcus. This happens between 35 and 37 weeks.  Your health care provider may ask you:  What your birth plan is.  How you are feeling.  If you are feeling the baby move.  If you have had   any abnormal symptoms, such as leaking fluid, bleeding, severe headaches, or abdominal cramping.  If you are using any tobacco products, including cigarettes, chewing tobacco, and electronic cigarettes.  If you have any questions.  Other tests or screenings that may be performed during your third trimester include:  Blood tests that check for low iron levels (anemia).  Fetal testing to check the health, activity level, and growth of the fetus. Testing is done if you have certain medical conditions or if there are problems during the  pregnancy.  Nonstress test (NST). This test checks the health of your baby to make sure there are no signs of problems, such as the baby not getting enough oxygen. During this test, a belt is placed around your belly. The baby is made to move, and its heart rate is monitored during movement.  What is false labor? False labor is a condition in which you feel small, irregular tightenings of the muscles in the womb (contractions) that usually go away with rest, changing position, or drinking water. These are called Braxton Hicks contractions. Contractions may last for hours, days, or even weeks before true labor sets in. If contractions come at regular intervals, become more frequent, increase in intensity, or become painful, you should see your health care provider. What are the signs of labor?  Abdominal cramps.  Regular contractions that start at 10 minutes apart and become stronger and more frequent with time.  Contractions that start on the top of the uterus and spread down to the lower abdomen and back.  Increased pelvic pressure and dull back pain.  A watery or bloody mucus discharge that comes from the vagina.  Leaking of amniotic fluid. This is also known as your "water breaking." It could be a slow trickle or a gush. Let your health care provider know if it has a color or strange odor. If you have any of these signs, call your health care provider right away, even if it is before your due date. Follow these instructions at home: Medicines  Follow your health care provider's instructions regarding medicine use. Specific medicines may be either safe or unsafe to take during pregnancy.  Take a prenatal vitamin that contains at least 600 micrograms (mcg) of folic acid.  If you develop constipation, try taking a stool softener if your health care provider approves. Eating and drinking  Eat a balanced diet that includes fresh fruits and vegetables, whole grains, good sources of protein  such as meat, eggs, or tofu, and low-fat dairy. Your health care provider will help you determine the amount of weight gain that is right for you.  Avoid raw meat and uncooked cheese. These carry germs that can cause birth defects in the baby.  If you have low calcium intake from food, talk to your health care provider about whether you should take a daily calcium supplement.  Eat four or five small meals rather than three large meals a day.  Limit foods that are high in fat and processed sugars, such as fried and sweet foods.  To prevent constipation: ? Drink enough fluid to keep your urine clear or pale yellow. ? Eat foods that are high in fiber, such as fresh fruits and vegetables, whole grains, and beans. Activity  Exercise only as directed by your health care provider. Most women can continue their usual exercise routine during pregnancy. Try to exercise for 30 minutes at least 5 days a week. Stop exercising if you experience uterine contractions.  Avoid heavy   lifting.  Do not exercise in extreme heat or humidity, or at high altitudes.  Wear low-heel, comfortable shoes.  Practice good posture.  You may continue to have sex unless your health care provider tells you otherwise. Relieving pain and discomfort  Take frequent breaks and rest with your legs elevated if you have leg cramps or low back pain.  Take warm sitz baths to soothe any pain or discomfort caused by hemorrhoids. Use hemorrhoid cream if your health care provider approves.  Wear a good support bra to prevent discomfort from breast tenderness.  If you develop varicose veins: ? Wear support pantyhose or compression stockings as told by your healthcare provider. ? Elevate your feet for 15 minutes, 3-4 times a day. Prenatal care  Write down your questions. Take them to your prenatal visits.  Keep all your prenatal visits as told by your health care provider. This is important. Safety  Wear your seat belt at  all times when driving.  Make a list of emergency phone numbers, including numbers for family, friends, the hospital, and police and fire departments. General instructions  Avoid cat litter boxes and soil used by cats. These carry germs that can cause birth defects in the baby. If you have a cat, ask someone to clean the litter box for you.  Do not travel far distances unless it is absolutely necessary and only with the approval of your health care provider.  Do not use hot tubs, steam rooms, or saunas.  Do not drink alcohol.  Do not use any products that contain nicotine or tobacco, such as cigarettes and e-cigarettes. If you need help quitting, ask your health care provider.  Do not use any medicinal herbs or unprescribed drugs. These chemicals affect the formation and growth of the baby.  Do not douche or use tampons or scented sanitary pads.  Do not cross your legs for long periods of time.  To prepare for the arrival of your baby: ? Take prenatal classes to understand, practice, and ask questions about labor and delivery. ? Make a trial run to the hospital. ? Visit the hospital and tour the maternity area. ? Arrange for maternity or paternity leave through employers. ? Arrange for family and friends to take care of pets while you are in the hospital. ? Purchase a rear-facing car seat and make sure you know how to install it in your car. ? Pack your hospital bag. ? Prepare the baby's nursery. Make sure to remove all pillows and stuffed animals from the baby's crib to prevent suffocation.  Visit your dentist if you have not gone during your pregnancy. Use a soft toothbrush to brush your teeth and be gentle when you floss. Contact a health care provider if:  You are unsure if you are in labor or if your water has broken.  You become dizzy.  You have mild pelvic cramps, pelvic pressure, or nagging pain in your abdominal area.  You have lower back pain.  You have persistent  nausea, vomiting, or diarrhea.  You have an unusual or bad smelling vaginal discharge.  You have pain when you urinate. Get help right away if:  Your water breaks before 37 weeks.  You have regular contractions less than 5 minutes apart before 37 weeks.  You have a fever.  You are leaking fluid from your vagina.  You have spotting or bleeding from your vagina.  You have severe abdominal pain or cramping.  You have rapid weight loss or weight gain.    You have shortness of breath with chest pain.  You notice sudden or extreme swelling of your face, hands, ankles, feet, or legs.  Your baby makes fewer than 10 movements in 2 hours.  You have severe headaches that do not go away when you take medicine.  You have vision changes. Summary  The third trimester is from week 28 through week 40, months 7 through 9. The third trimester is a time when the unborn baby (fetus) is growing rapidly.  During the third trimester, your discomfort may increase as you and your baby continue to gain weight. You may have abdominal, leg, and back pain, sleeping problems, and an increased need to urinate.  During the third trimester your breasts will keep growing and they will continue to become tender. A yellow fluid (colostrum) may leak from your breasts. This is the first milk you are producing for your baby.  False labor is a condition in which you feel small, irregular tightenings of the muscles in the womb (contractions) that eventually go away. These are called Braxton Hicks contractions. Contractions may last for hours, days, or even weeks before true labor sets in.  Signs of labor can include: abdominal cramps; regular contractions that start at 10 minutes apart and become stronger and more frequent with time; watery or bloody mucus discharge that comes from the vagina; increased pelvic pressure and dull back pain; and leaking of amniotic fluid. This information is not intended to replace advice  given to you by your health care provider. Make sure you discuss any questions you have with your health care provider. Document Released: 06/16/2001 Document Revised: 11/28/2015 Document Reviewed: 08/23/2012 Elsevier Interactive Patient Education  2017 [employer]  

## 2018-02-01 NOTE — Progress Notes (Signed)
Subjective:  Jasmin Wolfe is a 22 y.o. G2P0010 at 6931w6d being seen today for ongoing prenatal care.  She is currently monitored for the following issues for this low-risk pregnancy and has Supervision of normal first pregnancy, antepartum; Obesity affecting pregnancy in second trimester; Obesity, Class II, BMI 35-39.9; and Obesity affecting pregnancy in third trimester on their problem list.  Patient reports no complaints.  Contractions: Not present. Vag. Bleeding: None.  Movement: Present. Denies leaking of fluid.   The following portions of the patient's history were reviewed and updated as appropriate: allergies, current medications, past family history, past medical history, past social history, past surgical history and problem list. Problem list updated.  Objective:   Vitals:   02/01/18 0837  BP: 110/69  Pulse: 79  Weight: 239 lb (108.4 kg)    Fetal Status: Fetal Heart Rate (bpm): 139   Movement: Present     General:  Alert, oriented and cooperative. Patient is in no acute distress.  Skin: Skin is warm and dry. No rash noted.   Cardiovascular: Normal heart rate noted  Respiratory: Normal respiratory effort, no problems with respiration noted  Abdomen: Soft, gravid, appropriate for gestational age. Pain/Pressure: Absent     Pelvic:  Cervical exam performed        Extremities: Normal range of motion.  Edema: None  Mental Status: Normal mood and affect. Normal behavior. Normal judgment and thought content.   Urinalysis:      Assessment and Plan:  Pregnancy: G2P0010 at 7231w6d  1. Supervision of normal first pregnancy, antepartum Stable GBS/vaginal cultures completed today  Preterm labor symptoms and general obstetric precautions including but not limited to vaginal bleeding, contractions, leaking of fluid and fetal movement were reviewed in detail with the patient. Please refer to After Visit Summary for other counseling recommendations.  Return in about 1 week (around  02/08/2018) for OB visit.   Hermina StaggersErvin, Tavian Callander L, MD

## 2018-02-02 LAB — GC/CHLAMYDIA PROBE AMP (~~LOC~~) NOT AT ARMC
CHLAMYDIA, DNA PROBE: NEGATIVE
NEISSERIA GONORRHEA: NEGATIVE

## 2018-02-05 LAB — CULTURE, BETA STREP (GROUP B ONLY): Strep Gp B Culture: NEGATIVE

## 2018-02-14 ENCOUNTER — Ambulatory Visit (INDEPENDENT_AMBULATORY_CARE_PROVIDER_SITE_OTHER): Payer: Medicaid Other | Admitting: Obstetrics & Gynecology

## 2018-02-14 VITALS — BP 115/70 | HR 75 | Wt 244.0 lb

## 2018-02-14 DIAGNOSIS — O99213 Obesity complicating pregnancy, third trimester: Secondary | ICD-10-CM

## 2018-02-14 DIAGNOSIS — Z3400 Encounter for supervision of normal first pregnancy, unspecified trimester: Secondary | ICD-10-CM

## 2018-02-14 DIAGNOSIS — O99013 Anemia complicating pregnancy, third trimester: Secondary | ICD-10-CM

## 2018-02-14 NOTE — Progress Notes (Signed)
   PRENATAL VISIT NOTE  Subjective:  Jasmin Wolfe is a 22 y.o. G2P0010 at 5845w5d being seen today for ongoing prenatal care.  She is currently monitored for the following issues for this high-risk pregnancy and has Supervision of normal first pregnancy, antepartum; Obesity affecting pregnancy in second trimester; Obesity, Class II, BMI 35-39.9; and Obesity affecting pregnancy in third trimester on their problem list.  Patient reports begin very inactive. She does report feeling lightheaded with walking outside. She reports adequate water intake.  Contractions: Not present. Vag. Bleeding: None.  Movement: Present. Denies leaking of fluid.   The following portions of the patient's history were reviewed and updated as appropriate: allergies, current medications, past family history, past medical history, past social history, past surgical history and problem list. Problem list updated.  Objective:   Vitals:   02/14/18 1425  BP: 115/70  Pulse: 75  Weight: 244 lb (110.7 kg)    Fetal Status: Fetal Heart Rate (bpm): 134   Movement: Present     General:  Alert, oriented and cooperative. Patient is in no acute distress.  Skin: Skin is warm and dry. No rash noted.   Cardiovascular: Normal heart rate noted  Respiratory: Normal respiratory effort, no problems with respiration noted  Abdomen: Soft, gravid, appropriate for gestational age.  Pain/Pressure: Absent     Pelvic: Cervical exam performed        Extremities: Normal range of motion.  Edema: Trace  Mental Status: Normal mood and affect. Normal behavior. Normal judgment and thought content.   Assessment and Plan:  Pregnancy: G2P0010 at 2045w5d  1. Supervision of normal first pregnancy, antepartum  2. Obesity affecting pregnancy in third trimester  3. Anemia- ? sympotmatic CBC today  Term labor symptoms and general obstetric precautions including but not limited to vaginal bleeding, contractions, leaking of fluid and fetal movement were  reviewed in detail with the patient. Please refer to After Visit Summary for other counseling recommendations.  Return in about 1 week (around 02/21/2018).  Future Appointments  Date Time Provider Department Center  02/21/2018  1:35 PM Hermina StaggersErvin, Michael L, MD [facility]    Willodean Rosenthalarolyn Harraway-Smith, MD

## 2018-02-14 NOTE — Patient Instructions (Signed)

## 2018-02-15 LAB — CBC
Hematocrit: 32.7 % — ABNORMAL LOW (ref 34.0–46.6)
Hemoglobin: 10.8 g/dL — ABNORMAL LOW (ref 11.1–15.9)
MCH: 28.7 pg (ref 26.6–33.0)
MCHC: 33 g/dL (ref 31.5–35.7)
MCV: 87 fL (ref 79–97)
PLATELETS: 219 10*3/uL (ref 150–450)
RBC: 3.76 x10E6/uL — ABNORMAL LOW (ref 3.77–5.28)
RDW: 13.8 % (ref 12.3–15.4)
WBC: 6.7 10*3/uL (ref 3.4–10.8)

## 2018-02-21 ENCOUNTER — Encounter: Payer: Self-pay | Admitting: Advanced Practice Midwife

## 2018-02-21 ENCOUNTER — Ambulatory Visit (INDEPENDENT_AMBULATORY_CARE_PROVIDER_SITE_OTHER): Payer: Medicaid Other | Admitting: Advanced Practice Midwife

## 2018-02-21 DIAGNOSIS — Z3400 Encounter for supervision of normal first pregnancy, unspecified trimester: Secondary | ICD-10-CM

## 2018-02-21 DIAGNOSIS — Z3483 Encounter for supervision of other normal pregnancy, third trimester: Secondary | ICD-10-CM

## 2018-02-21 NOTE — Progress Notes (Signed)
   PRENATAL VISIT NOTE  Subjective:  Jasmin Wolfe is a 22 y.o. G2P0010 at 4186w5d being seen today for ongoing prenatal care.  She is currently monitored for the following issues for this low-risk pregnancy and has Supervision of normal first pregnancy, antepartum; Obesity affecting pregnancy in second trimester; Obesity, Class II, BMI 35-39.9; and Obesity affecting pregnancy in third trimester on their problem list.  Patient reports no complaints.  Contractions: Not present. Vag. Bleeding: None.  Movement: Present. Denies leaking of fluid.   The following portions of the patient's history were reviewed and updated as appropriate: allergies, current medications, past family history, past medical history, past social history, past surgical history and problem list. Problem list updated.  Objective:   Vitals:   02/21/18 1345  BP: 120/76  Pulse: 82  Weight: 244 lb (110.7 kg)    Fetal Status: Fetal Heart Rate (bpm): 154 Fundal Height: 38 cm Movement: Present     General:  Alert, oriented and cooperative. Patient is in no acute distress.  Skin: Skin is warm and dry. No rash noted.   Cardiovascular: Normal heart rate noted  Respiratory: Normal respiratory effort, no problems with respiration noted  Abdomen: Soft, gravid, appropriate for gestational age.  Pain/Pressure: Absent     Pelvic: Cervical exam performed Dilation: Fingertip Effacement (%): 40, 50 Station: -3  Extremities: Normal range of motion.  Edema: Trace  Mental Status: Normal mood and affect. Normal behavior. Normal judgment and thought content.   Assessment and Plan:  Pregnancy: G2P0010 at 7686w5d  1. Supervision of normal first pregnancy, antepartum  - reviewed signs of labor, warning signs, and location of MAU  - reviewed BC options, condom still preferred  Term labor symptoms and general obstetric precautions including but not limited to vaginal bleeding, contractions, leaking of fluid and fetal movement were reviewed in  detail with the patient. Please refer to After Visit Summary for other counseling recommendations.  Return in about 1 week (around 02/28/2018).  Future Appointments  Date Time Provider Department Center  02/28/2018  9:15 AM Armando ReichertHogan, Heather D, CNM [facility]    Margarita RanaHarrison D Glennie Rodda, Student-PA 2:11pm

## 2018-02-21 NOTE — Progress Notes (Signed)
   PRENATAL VISIT NOTE  Subjective:  Jasmin Wolfe is a 22 y.o. G2P0010 at 6660w5d being seen today for ongoing prenatal care.  She is currently monitored for the following issues for this low-risk pregnancy and has Supervision of normal first pregnancy, antepartum; Obesity affecting pregnancy in second trimester; Obesity, Class II, BMI 35-39.9; and Obesity affecting pregnancy in third trimester on their problem list.  Patient reports no complaints.  Contractions: Not present. Vag. Bleeding: None.  Movement: Present. Denies leaking of fluid.   The following portions of the patient's history were reviewed and updated as appropriate: allergies, current medications, past family history, past medical history, past social history, past surgical history and problem list. Problem list updated.  Objective:   Vitals:   02/21/18 1345  BP: 120/76  Pulse: 82  Weight: 244 lb (110.7 kg)    Fetal Status: Fetal Heart Rate (bpm): 154 Fundal Height: 38 cm Movement: Present     General:  Alert, oriented and cooperative. Patient is in no acute distress.  Skin: Skin is warm and dry. No rash noted.   Cardiovascular: Normal heart rate noted  Respiratory: Normal respiratory effort, no problems with respiration noted  Abdomen: Soft, gravid, appropriate for gestational age.  Pain/Pressure: Absent     Pelvic: Cervical exam performed Dilation: Fingertip Effacement (%): 40, 50 Station: -3  Extremities: Normal range of motion.  Edema: Trace  Mental Status: Normal mood and affect. Normal behavior. Normal judgment and thought content.   Assessment and Plan:  Pregnancy: G2P0010 at 5860w5d  1. Supervision of normal first pregnancy, antepartum - Routine care  Term labor symptoms and general obstetric precautions including but not limited to vaginal bleeding, contractions, leaking of fluid and fetal movement were reviewed in detail with the patient. Please refer to After Visit Summary for other counseling  recommendations.  Return in about 1 week (around 02/28/2018).  Future Appointments  Date Time Provider Department Center  02/28/2018  9:15 AM Armando ReichertHogan, Heather D, [facility]    Thressa ShellerHeather Hogan, CNM

## 2018-02-22 ENCOUNTER — Encounter: Payer: Self-pay | Admitting: Obstetrics & Gynecology

## 2018-02-22 DIAGNOSIS — O99019 Anemia complicating pregnancy, unspecified trimester: Secondary | ICD-10-CM | POA: Insufficient documentation

## 2018-02-28 ENCOUNTER — Encounter: Payer: Self-pay | Admitting: Advanced Practice Midwife

## 2018-02-28 ENCOUNTER — Ambulatory Visit (INDEPENDENT_AMBULATORY_CARE_PROVIDER_SITE_OTHER): Payer: Medicaid Other | Admitting: Advanced Practice Midwife

## 2018-02-28 VITALS — BP 113/71 | HR 74 | Wt 245.0 lb

## 2018-02-28 DIAGNOSIS — Z3400 Encounter for supervision of normal first pregnancy, unspecified trimester: Secondary | ICD-10-CM

## 2018-02-28 NOTE — Progress Notes (Signed)
   PRENATAL VISIT NOTE  Subjective:  Monica Martinezieleah Scollard is a 22 y.o. G2P0010 at 6973w5d being seen today for ongoing prenatal care.  She is currently monitored for the following issues for this low-risk pregnancy and has Supervision of normal first pregnancy, antepartum; Obesity affecting pregnancy in second trimester; Obesity, Class II, BMI 35-39.9; Obesity affecting pregnancy in third trimester; and Anemia, antepartum on their problem list.  Patient reports no complaints.  Contractions: Not present. Vag. Bleeding: None.  Movement: Present. Denies leaking of fluid.   The following portions of the patient's history were reviewed and updated as appropriate: allergies, current medications, past family history, past medical history, past social history, past surgical history and problem list. Problem list updated.  Objective:   Vitals:   02/28/18 0927  BP: 113/71  Pulse: 74  Weight: 245 lb (111.1 kg)    Fetal Status: Fetal Heart Rate (bpm): 145 Fundal Height: 38 cm Movement: Present  Presentation: Vertex  General:  Alert, oriented and cooperative. Patient is in no acute distress.  Skin: Skin is warm and dry. No rash noted.   Cardiovascular: Normal heart rate noted  Respiratory: Normal respiratory effort, no problems with respiration noted  Abdomen: Soft, gravid, appropriate for gestational age.  Pain/Pressure: Present     Pelvic: Cervical exam performed Dilation: 1 Effacement (%): 50 Station: -2  Extremities: Normal range of motion.  Edema: None  Mental Status: Normal mood and affect. Normal behavior. Normal judgment and thought content.   Assessment and Plan:  Pregnancy: G2P0010 at 2773w5d  1. Supervision of normal first pregnancy, antepartum - Routine care - BPP/NST on 8/30 - IOL scheduled for 03/10/18 @ 7:30, will come to clinic on 03/09/18 for FB placement   Term labor symptoms and general obstetric precautions including but not limited to vaginal bleeding, contractions, leaking of fluid  and fetal movement were reviewed in detail with the patient. Please refer to After Visit Summary for other counseling recommendations.  Return in about 1 week (around 03/07/2018).  Future Appointments  Date Time Provider Department Center  03/10/2018  7:30 AM [facility] None    Thressa ShellerHeather Ellington Cornia, CNM

## 2018-03-01 ENCOUNTER — Telehealth (HOSPITAL_COMMUNITY): Payer: Self-pay | Admitting: *Deleted

## 2018-03-01 NOTE — Telephone Encounter (Signed)
Preadmission screen  

## 2018-03-03 ENCOUNTER — Ambulatory Visit (INDEPENDENT_AMBULATORY_CARE_PROVIDER_SITE_OTHER): Payer: Medicaid Other | Admitting: *Deleted

## 2018-03-03 ENCOUNTER — Ambulatory Visit: Payer: Self-pay

## 2018-03-03 VITALS — BP 107/69 | HR 85 | Wt 253.1 lb

## 2018-03-03 DIAGNOSIS — O480 Post-term pregnancy: Secondary | ICD-10-CM

## 2018-03-03 NOTE — Progress Notes (Signed)

## 2018-03-09 ENCOUNTER — Ambulatory Visit (INDEPENDENT_AMBULATORY_CARE_PROVIDER_SITE_OTHER): Payer: Medicaid Other | Admitting: Obstetrics and Gynecology

## 2018-03-09 ENCOUNTER — Ambulatory Visit (INDEPENDENT_AMBULATORY_CARE_PROVIDER_SITE_OTHER): Payer: Medicaid Other | Admitting: *Deleted

## 2018-03-09 ENCOUNTER — Encounter: Payer: Self-pay | Admitting: Obstetrics and Gynecology

## 2018-03-09 VITALS — BP 110/70 | HR 71 | Wt 250.7 lb

## 2018-03-09 DIAGNOSIS — O99013 Anemia complicating pregnancy, third trimester: Secondary | ICD-10-CM

## 2018-03-09 DIAGNOSIS — O480 Post-term pregnancy: Secondary | ICD-10-CM

## 2018-03-09 DIAGNOSIS — Z3400 Encounter for supervision of normal first pregnancy, unspecified trimester: Secondary | ICD-10-CM

## 2018-03-09 DIAGNOSIS — Z3403 Encounter for supervision of normal first pregnancy, third trimester: Secondary | ICD-10-CM

## 2018-03-09 NOTE — Patient Instructions (Signed)

## 2018-03-09 NOTE — Progress Notes (Signed)
IOL scheduled 9/5 @ 0730

## 2018-03-09 NOTE — Progress Notes (Signed)
   PRENATAL VISIT NOTE  Subjective:  Jasmin Wolfe is a 22 y.o. G2P0010 at [redacted]w[redacted]d being seen today for ongoing prenatal care.  She is currently monitored for the following issues for this low-risk pregnancy and has Supervision of normal first pregnancy, antepartum; Obesity affecting pregnancy in second trimester; Obesity, Class II, BMI 35-39.9; Obesity affecting pregnancy in third trimester; and Anemia, antepartum on their problem list.  Patient reports general discomforts of pregnancy.  Contractions: Irregular. Vag. Bleeding: None.  Movement: Present. Denies leaking of fluid.   The following portions of the patient's history were reviewed and updated as appropriate: allergies, current medications, past family history, past medical history, past social history, past surgical history and problem list. Problem list updated.  Objective:   Vitals:   03/09/18 1443  BP: 110/70  Pulse: 71  Weight: 250 lb 11.2 oz (113.7 kg)    Fetal Status: Fetal Heart Rate (bpm): NST   Movement: Present     General:  Alert, oriented and cooperative. Patient is in no acute distress.  Skin: Skin is warm and dry. No rash noted.   Cardiovascular: Normal heart rate noted  Respiratory: Normal respiratory effort, no problems with respiration noted  Abdomen: Soft, gravid, appropriate for gestational age.  Pain/Pressure: Present     Pelvic: Cervical exam performed        Extremities: Normal range of motion.  Edema: None  Mental Status:  Normal mood and affect. Normal behavior. Normal judgment and thought content.   Procedure: Patient informed of R/B/A of procedure. NST was performed and was reactive prior to procedure. Procedure done to begin ripening of the cervix prior to admission for induction of labor. Appropriate time out taken. The patient was placed in the lithotomy position and the cervix brought into view with sterile speculum. A ring forcep was used to guide the 22F foley balloon through the internal os  of the cervix. Foley Balloon filled with 60cc of normal saline. Plug inserted into end of the foley. Foley placed on tension and taped to medial thigh.   NST was completed after procedure and noted to be reactive There were no signs of tachysystole or hypertonus. All equipment was removed and accounted for. The patient tolerated the procedure well.  Assessment and Plan:  Pregnancy: G2P0010 at [redacted]w[redacted]d 1. Supervision of normal first pregnancy, antepartum Stable IOL in AM  2. Post term pregnancy, antepartum condition or complication IOL in AM    S/p Outpatient placement of foley balloon catheter for cervical ripening. Induction of labor scheduled for tomorrow . Reassuring FHR tracing with no concerns at present. Warning signs given to patient to include return to MAU for heavy vaginal bleeding, Rupture of membranes, painful uterine contractions q 5 mins or less, severe abdominal discomfort, decreased fetal movement.  Return in about 4 weeks (around 04/06/2018).   Hermina Staggers, MD 03/09/2018 3:59 PM

## 2018-03-10 ENCOUNTER — Other Ambulatory Visit: Payer: Self-pay

## 2018-03-10 ENCOUNTER — Inpatient Hospital Stay (HOSPITAL_COMMUNITY)
Admission: RE | Admit: 2018-03-10 | Discharge: 2018-03-13 | DRG: 807 | Disposition: A | Discharge status: Home / Self Care | Payer: Medicaid Other | Attending: Obstetrics & Gynecology | Admitting: Obstetrics & Gynecology

## 2018-03-10 ENCOUNTER — Inpatient Hospital Stay (HOSPITAL_COMMUNITY): Payer: Medicaid Other | Admitting: Anesthesiology

## 2018-03-10 ENCOUNTER — Encounter (HOSPITAL_COMMUNITY): Payer: Self-pay

## 2018-03-10 DIAGNOSIS — Z3400 Encounter for supervision of normal first pregnancy, unspecified trimester: Secondary | ICD-10-CM

## 2018-03-10 DIAGNOSIS — O9902 Anemia complicating childbirth: Secondary | ICD-10-CM | POA: Diagnosis present

## 2018-03-10 DIAGNOSIS — O99019 Anemia complicating pregnancy, unspecified trimester: Secondary | ICD-10-CM

## 2018-03-10 DIAGNOSIS — D649 Anemia, unspecified: Secondary | ICD-10-CM | POA: Diagnosis present

## 2018-03-10 DIAGNOSIS — O99214 Obesity complicating childbirth: Secondary | ICD-10-CM | POA: Diagnosis present

## 2018-03-10 DIAGNOSIS — E669 Obesity, unspecified: Secondary | ICD-10-CM | POA: Diagnosis present

## 2018-03-10 DIAGNOSIS — Z3A41 41 weeks gestation of pregnancy: Secondary | ICD-10-CM | POA: Diagnosis not present

## 2018-03-10 DIAGNOSIS — O99212 Obesity complicating pregnancy, second trimester: Secondary | ICD-10-CM

## 2018-03-10 DIAGNOSIS — O480 Post-term pregnancy: Secondary | ICD-10-CM | POA: Diagnosis present

## 2018-03-10 LAB — CBC
HCT: 33.3 % — ABNORMAL LOW (ref 36.0–46.0)
Hemoglobin: 11.3 g/dL — ABNORMAL LOW (ref 12.0–15.0)
MCH: 29.5 pg (ref 26.0–34.0)
MCHC: 33.9 g/dL (ref 30.0–36.0)
MCV: 86.9 fL (ref 78.0–100.0)
PLATELETS: 190 10*3/uL (ref 150–400)
RBC: 3.83 MIL/uL — AB (ref 3.87–5.11)
RDW: 14.2 % (ref 11.5–15.5)
WBC: 7.6 10*3/uL (ref 4.0–10.5)

## 2018-03-10 LAB — TYPE AND SCREEN
ABO/RH(D): O POS
ANTIBODY SCREEN: NEGATIVE

## 2018-03-10 LAB — RPR: RPR Ser Ql: NONREACTIVE

## 2018-03-10 LAB — ABO/RH: ABO/RH(D): O POS

## 2018-03-10 MED ORDER — LIDOCAINE HCL (PF) 1 % IJ SOLN
30.0000 mL | INTRAMUSCULAR | Status: DC | PRN
  Filled 2018-03-10: qty 30

## 2018-03-10 MED ORDER — OXYTOCIN BOLUS FROM INFUSION
500.0000 mL | Freq: Once | INTRAVENOUS | Status: AC
  Administered 2018-03-11: 500 mL via INTRAVENOUS

## 2018-03-10 MED ORDER — OXYCODONE-ACETAMINOPHEN 5-325 MG PO TABS: 2.0000 | ORAL_TABLET | ORAL | Status: DC | PRN

## 2018-03-10 MED ORDER — DIPHENHYDRAMINE HCL 50 MG/ML IJ SOLN: 12.5000 mg | INTRAMUSCULAR | Status: DC | PRN

## 2018-03-10 MED ORDER — SOD CITRATE-CITRIC ACID 500-334 MG/5ML PO SOLN: 30.0000 mL | ORAL | Status: DC | PRN

## 2018-03-10 MED ORDER — PHENYLEPHRINE 40 MCG/ML (10ML) SYRINGE FOR IV PUSH (FOR BLOOD PRESSURE SUPPORT)
80.0000 ug | PREFILLED_SYRINGE | INTRAVENOUS | Status: DC | PRN
  Filled 2018-03-10: qty 5

## 2018-03-10 MED ORDER — LACTATED RINGERS IV SOLN
INTRAVENOUS | Status: DC
  Administered 2018-03-10 (×2): via INTRAVENOUS

## 2018-03-10 MED ORDER — LACTATED RINGERS IV SOLN: 500.0000 mL | Freq: Once | INTRAVENOUS | Status: DC

## 2018-03-10 MED ORDER — PHENYLEPHRINE 40 MCG/ML (10ML) SYRINGE FOR IV PUSH (FOR BLOOD PRESSURE SUPPORT)
PREFILLED_SYRINGE | INTRAVENOUS | Status: AC
  Filled 2018-03-10: qty 10

## 2018-03-10 MED ORDER — ONDANSETRON HCL 4 MG/2ML IJ SOLN
4.0000 mg | Freq: Four times a day (QID) | INTRAMUSCULAR | Status: DC | PRN
  Administered 2018-03-10: 4 mg via INTRAVENOUS
  Filled 2018-03-10: qty 2

## 2018-03-10 MED ORDER — OXYTOCIN 40 UNITS IN LACTATED RINGERS INFUSION - SIMPLE MED
1.0000 m[IU]/min | INTRAVENOUS | Status: DC
  Administered 2018-03-10: 16 m[IU]/min via INTRAVENOUS
  Administered 2018-03-10: 2 m[IU]/min via INTRAVENOUS
  Filled 2018-03-10 (×2): qty 1000

## 2018-03-10 MED ORDER — ACETAMINOPHEN 325 MG PO TABS
650.0000 mg | ORAL_TABLET | ORAL | Status: DC | PRN
  Administered 2018-03-11: 650 mg via ORAL
  Filled 2018-03-10: qty 2

## 2018-03-10 MED ORDER — EPHEDRINE 5 MG/ML INJ
10.0000 mg | INTRAVENOUS | Status: DC | PRN
  Filled 2018-03-10: qty 2

## 2018-03-10 MED ORDER — NALBUPHINE HCL 10 MG/ML IJ SOLN: 5.0000 mg | INTRAMUSCULAR | Status: DC | PRN

## 2018-03-10 MED ORDER — FENTANYL 2.5 MCG/ML BUPIVACAINE 1/10 % EPIDURAL INFUSION (WH - ANES)
14.0000 mL/h | INTRAMUSCULAR | Status: DC | PRN
  Administered 2018-03-10 (×2): 14 mL/h via EPIDURAL
  Filled 2018-03-10: qty 100

## 2018-03-10 MED ORDER — LACTATED RINGERS IV SOLN: 500.0000 mL | INTRAVENOUS | Status: DC | PRN

## 2018-03-10 MED ORDER — OXYCODONE-ACETAMINOPHEN 5-325 MG PO TABS: 1.0000 | ORAL_TABLET | ORAL | Status: DC | PRN

## 2018-03-10 MED ORDER — TERBUTALINE SULFATE 1 MG/ML IJ SOLN
0.2500 mg | Freq: Once | INTRAMUSCULAR | Status: DC | PRN
  Filled 2018-03-10: qty 1

## 2018-03-10 MED ORDER — LIDOCAINE HCL (PF) 1 % IJ SOLN
INTRAMUSCULAR | Status: DC | PRN
  Administered 2018-03-10: 5 mL via EPIDURAL

## 2018-03-10 MED ORDER — OXYTOCIN 40 UNITS IN LACTATED RINGERS INFUSION - SIMPLE MED
2.5000 [IU]/h | INTRAVENOUS | Status: DC
  Administered 2018-03-11: 2.5 [IU]/h via INTRAVENOUS
  Filled 2018-03-10: qty 1000

## 2018-03-10 MED ORDER — FENTANYL 2.5 MCG/ML BUPIVACAINE 1/10 % EPIDURAL INFUSION (WH - ANES)
INTRAMUSCULAR | Status: AC
  Filled 2018-03-10: qty 100

## 2018-03-10 NOTE — H&P (Signed)
OBSTETRIC ADMISSION HISTORY AND PHYSICAL  Easton Gales is a 22 y.o. female G2P0010 with IUP at [redacted]w[redacted]d by LMP presenting for IOL for postdates. She reports +FMs, No LOF, no VB, no blurry vision, headaches or peripheral edema, and RUQ pain.  She plans on breast feeding. She request condoms for birth control. She received her prenatal care at [facility]   Dating: By LMP --->  Estimated Date of Delivery: 03/02/18  Clinic  [facility] Prenatal Labs  Dating LMP Blood type: O/Positive/-- (02/20 5038)   Genetic Screen AFP: negative    NIPS: low risk, female  Antibody:Negative (02/20 8828)  Anatomic [place] UTD, growth 50%tile at 35 weeks  Rubella: 2.63 (02/20 0943)  GTT Early:               Third trimester: 83/103/87 RPR: Non Reactive (02/20 0943)   Flu vaccine 08/25/17 HBsAg: Negative (02/20 0943)   TDaP vaccine   12/08/17                         Rhogam: O+ HIV: Non Reactive (02/20 0943)  Baby Food Breast                                            GBS: Negative   Contraception  Condoms Pap: 08/25/17 NIL   Circumcision  n/a   Pediatrician List given  CF: Negative 32 mutations   Support Person  Sister, Aeronautical engineer SMA: 2 copies, reduced carrier risk   Prenatal Classes Info given  Hgb electrophoresis: NL hgb    Prenatal History/Complications:  Past Medical History: History reviewed. No pertinent past medical history.  Past Surgical History: History reviewed. No pertinent surgical history.  Obstetrical History: OB History    Gravida  2   Para      Term      Preterm      AB  1   Living        SAB      TAB  1   Ectopic      Multiple      Live Births              Social History: Social History   Socioeconomic History  . Marital status: Single    Spouse name: Not on file  . Number of children: Not on file  . Years of education: Not on file  . Highest education level: Not on file  Occupational History  . Not on file  Social Needs  . Financial resource strain: Not hard at all  . Food  insecurity:    Worry: Never true    Inability: Never true  . Transportation needs:    Medical: No    Non-medical: No  Tobacco Use  . Smoking status: Never Smoker  . Smokeless tobacco: Never Used  Substance and Sexual Activity  . Alcohol use: No    Frequency: Never  . Drug use: Yes    Types: Marijuana  . Sexual activity: Yes    Birth control/protection: None  Lifestyle  . Physical activity:    Days per week: 0 days    Minutes per session: 0 min  . Stress: Only a little  Relationships  . Social connections:    Talks on phone: More than three times a week    Gets together: More than three times a week  Attends religious service: 1 to 4 times per year    Active member of club or organization: Yes    Attends meetings of clubs or organizations: 1 to 4 times per year    Relationship status: Never married  Other Topics Concern  . Not on file  Social History Narrative  . Not on file    Family History: Family History  Problem Relation Age of Onset  . Hypertension Mother   . Hypertension Father     Allergies: No Known Allergies  Medications Prior to Admission  Medication Sig Dispense Refill Last Dose  . ferrous sulfate (FERROUSUL) 325 (65 FE) MG tablet Take 1 tablet (325 mg total) by mouth daily with breakfast. 30 tablet 2 Taking  . Prenatal MV-Min-FA-Omega-3 (PRENATAL GUMMIES/DHA & FA) 0.4-32.5 MG CHEW Chew 1 tablet by mouth daily. 30 tablet 11 Taking   Review of Systems   All systems reviewed and negative except as stated in HPI  Blood pressure 120/71, pulse 80, temperature 97.8 F (36.6 C), temperature source Oral, last menstrual period 05/26/2017. General appearance: alert, cooperative and no distress Lungs: clear to auscultation bilaterally Heart: regular rate and rhythm Abdomen: soft, non-tender; bowel sounds normal Pelvic: n/a Extremities: Homans sign is negative, no sign of DVT DTR's +2 Presentation: cephalic Fetal monitoringBaseline: 120 bpm,  Variability: Good {> 6 bpm), Accelerations: Reactive and Decelerations: Absent Uterine activity: occasional uc's    Prenatal labs: ABO, Rh: O/Positive/-- (02/20 1610) Antibody: Negative (02/20 0943) Rubella: 2.63 (02/20 0943) RPR: Non Reactive (06/05 0814)  HBsAg: Negative (02/20 0943)  HIV: Non Reactive (06/05 0814)  GBS: Negative (07/30 0000)   Prenatal Transfer Tool  Maternal Diabetes: No Genetic Screening: Normal Maternal Ultrasounds/Referrals: Normal Fetal Ultrasounds or other Referrals:  None Maternal Substance Abuse:  No Significant Maternal Medications:  None Significant Maternal Lab Results: Lab values include: Group B Strep negative  Results for orders placed or performed during the hospital encounter of 03/10/18 (from the past 24 hour(s))  CBC   Collection Time: 03/10/18  8:34 AM  Result Value Ref Range   WBC 7.6 4.0 - 10.5 K/uL   RBC 3.83 (L) 3.87 - 5.11 MIL/uL   Hemoglobin 11.3 (L) 12.0 - 15.0 g/dL   HCT 96.0 (L) 45.4 - 09.8 %   MCV 86.9 78.0 - 100.0 fL   MCH 29.5 26.0 - 34.0 pg   MCHC 33.9 30.0 - 36.0 g/dL   RDW 11.9 14.7 - 82.9 %   Platelets 190 150 - 400 K/uL    Patient Active Problem List   Diagnosis Date Noted  . Post-dates pregnancy 03/10/2018  . Anemia, antepartum 02/22/2018  . Obesity affecting pregnancy in third trimester   . Obesity affecting pregnancy in second trimester 09/08/2017  . Obesity, Class II, BMI 35-39.9 09/08/2017  . Supervision of normal first pregnancy, antepartum 08/25/2017    Assessment/Plan:  Elaria Osias is a 22 y.o. G2P0010 at [redacted]w[redacted]d here for IOL for postdates.   #Labor: FB fell out upon arrival to MAU. Will start pitocin after patient eats. #Pain: Plans epidural #FWB: Cat 1 #ID:  GBS neg #MOF: Breast #MOC: Condoms #Circ:  n/a  Rolm Bookbinder, CNM  03/10/2018, 9:10 AM

## 2018-03-10 NOTE — Anesthesia Pain Management Evaluation Note (Signed)
  CRNA Pain Management Visit Note  Patient: Jasmin Wolfe, 22 y.o., female  "Hello I am a member of the anesthesia team at [facility]. We have an anesthesia team available at all times to provide care throughout the hospital, including epidural management and anesthesia for C-section. I don't know your plan for the delivery whether it a natural birth, water birth, IV sedation, nitrous supplementation, doula or epidural, but we want to meet your pain goals."   1.Was your pain managed to your expectations on prior hospitalizations?   Yes   2.What is your expectation for pain management during this hospitalization?     Epidural  3.How can we help you reach that goal? epidural  Record the patient's initial score and the patient's pain goal.   Pain: 2/10  Pain Goal: 0/10 [facility] wants you to be able to say your pain was always managed very well.  Salome Arnt 03/10/2018

## 2018-03-10 NOTE — Anesthesia Preprocedure Evaluation (Signed)
Anesthesia Evaluation  Patient identified by MRN, date of birth, ID band Patient awake    Reviewed: Allergy & Precautions, NPO status , Patient's Chart, lab work & pertinent test results  Airway Mallampati: II  TM Distance: >3 FB Neck ROM: Full    Dental no notable dental hx. (+) Teeth Intact   Pulmonary neg pulmonary ROS,    Pulmonary exam normal breath sounds clear to auscultation       Cardiovascular Normal cardiovascular exam Rhythm:Regular Rate:Normal     Neuro/Psych negative neurological ROS  negative psych ROS   GI/Hepatic   Endo/Other  Morbid obesity  Renal/GU      Musculoskeletal   Abdominal   Peds  Hematology  (+) anemia ,   Anesthesia Other Findings   Reproductive/Obstetrics (+) Pregnancy                             Lab Results  Component Value Date   WBC 7.6 03/10/2018   HGB 11.3 (L) 03/10/2018   HCT 33.3 (L) 03/10/2018   MCV 86.9 03/10/2018   PLT 190 03/10/2018    Anesthesia Physical Anesthesia Plan  ASA: III  Anesthesia Plan: Epidural   Post-op Pain Management:    Induction:   PONV Risk Score and Plan:   Airway Management Planned:   Additional Equipment:   Intra-op Plan:   Post-operative Plan:   Informed Consent: I have reviewed the patients History and Physical, chart, labs and discussed the procedure including the risks, benefits and alternatives for the proposed anesthesia with the patient or authorized representative who has indicated his/her understanding and acceptance.     Plan Discussed with:   Anesthesia Plan Comments:         Anesthesia Quick Evaluation

## 2018-03-10 NOTE — Progress Notes (Signed)
Vitals:   03/10/18 2005 03/10/18 2032  BP: 121/81 117/66  Pulse:  78  Resp: 18 16  Temp:    SpO2:     cx 5-6/90/-2  IUPC placed.  FHR Cat 1.  PItocin at 26 mu/min,. Continue present mgt

## 2018-03-10 NOTE — Progress Notes (Addendum)
Labor Progress Note Liliya Davisson is a 22 y.o. G2P0010 at [redacted]w[redacted]d presented for IOL d/t postdates. EDD: 03/02/18 by LMP  S: Pt is resting comfortably. Denies any pain or feeling any contractions.   O:  BP 124/86   Pulse 83   Temp 97.8 F (36.6 C) (Oral)   LMP 05/26/2017 (Exact Date)  EFM: 120 bpm/moderate variability/accels 15x15 decels absent  CVE: Dilation: 5 Effacement (%): 40 Cervical Position: Posterior Station: -3 Presentation: Vertex Exam by:: Polos RN    A&P: 22 y.o. G2P0010 [redacted]w[redacted]d for IOL d/t postdates.  #Labor: Progressing well. Continue pitocin and monitor.  #Pain: absent currently, plans epidural #FWB: Cat 1  #GBS negative   Zedrick Springsteen, Student-PA 1:25 PM

## 2018-03-10 NOTE — Anesthesia Procedure Notes (Signed)
Epidural Patient location during procedure: OB Start time: 03/10/2018 6:24 PM End time: 03/10/2018 6:41 PM  Staffing Anesthesiologist: Trevor Iha, MD Performed: anesthesiologist   Preanesthetic Checklist Completed: patient identified, site marked, surgical consent, pre-op evaluation, timeout performed, IV checked, risks and benefits discussed and monitors and equipment checked  Epidural Patient position: sitting Prep: site prepped and draped and DuraPrep Patient monitoring: continuous pulse ox and blood pressure Approach: midline Location: L3-L4 Injection technique: LOR air  Needle:  Needle type: Tuohy  Needle gauge: 17 G Needle length: 9 cm and 9 Needle insertion depth: 7 cm Catheter type: closed end flexible Catheter size: 19 Gauge Catheter at skin depth: 12 cm Test dose: negative  Assessment Events: blood not aspirated, injection not painful, no injection resistance, negative IV test and no paresthesia  Additional Notes 1 attempt. Pt tolerated procedure well.

## 2018-03-10 NOTE — Progress Notes (Signed)
Labor Progress Note Jasmin Wolfe is a 22 y.o. G2P0010 at [redacted]w[redacted]d presented for IOL for postdates  S:  Patient comfortable with epidural.  O:  BP 127/84   Pulse 70   Temp 98.3 F (36.8 C) (Oral)   Resp 18   LMP 05/26/2017 (Exact Date)   SpO2 97%   Fetal Tracing:  Baseline: 130 Variability: moderate Accels: 15x15 Decels: none  Toco: 2-4  CVE: Dilation: 6 Effacement (%): 90 Cervical Position: Middle Station: -2 Presentation: Vertex Exam by:: Johnathan Hausen RN    A&P: 22 y.o. G2P0010 [redacted]w[redacted]d IOL for postdates #Labor: Progressing well. Discussed with patient AROM for augmentation of labor. Patient agreeable to plan of care. AROM with small amount of clear fluid and bloody show.  #Pain: Epidural #FWB: Cat 1 #GBS negative  Rolm Bookbinder, CNM 7:19 PM

## 2018-03-11 ENCOUNTER — Encounter (HOSPITAL_COMMUNITY): Payer: Self-pay

## 2018-03-11 DIAGNOSIS — O480 Post-term pregnancy: Secondary | ICD-10-CM

## 2018-03-11 DIAGNOSIS — Z3A41 41 weeks gestation of pregnancy: Secondary | ICD-10-CM

## 2018-03-11 MED ORDER — ONDANSETRON HCL 4 MG PO TABS: 4.0000 mg | ORAL_TABLET | ORAL | Status: DC | PRN

## 2018-03-11 MED ORDER — BENZOCAINE-MENTHOL 20-0.5 % EX AERO
1.0000 "application " | INHALATION_SPRAY | CUTANEOUS | Status: DC | PRN
  Administered 2018-03-11: 1 via TOPICAL
  Filled 2018-03-11: qty 56

## 2018-03-11 MED ORDER — ZOLPIDEM TARTRATE 5 MG PO TABS: 5.0000 mg | ORAL_TABLET | Freq: Every evening | ORAL | Status: DC | PRN

## 2018-03-11 MED ORDER — PRENATAL MULTIVITAMIN CH: 1.0000 | ORAL_TABLET | Freq: Every day | ORAL | Status: DC

## 2018-03-11 MED ORDER — SIMETHICONE 80 MG PO CHEW: 80.0000 mg | CHEWABLE_TABLET | ORAL | Status: DC | PRN

## 2018-03-11 MED ORDER — BISACODYL 10 MG RE SUPP: 10.0000 mg | Freq: Every day | RECTAL | Status: DC | PRN

## 2018-03-11 MED ORDER — DOCUSATE SODIUM 100 MG PO CAPS
100.0000 mg | ORAL_CAPSULE | Freq: Two times a day (BID) | ORAL | Status: DC
  Administered 2018-03-12 (×2): 100 mg via ORAL
  Filled 2018-03-11 (×2): qty 1

## 2018-03-11 MED ORDER — IBUPROFEN 600 MG PO TABS
600.0000 mg | ORAL_TABLET | Freq: Four times a day (QID) | ORAL | Status: DC
  Administered 2018-03-11 – 2018-03-13 (×9): 600 mg via ORAL
  Filled 2018-03-11 (×9): qty 1

## 2018-03-11 MED ORDER — WITCH HAZEL-GLYCERIN EX PADS: 1.0000 "application " | MEDICATED_PAD | CUTANEOUS | Status: DC | PRN

## 2018-03-11 MED ORDER — OXYCODONE HCL 5 MG PO TABS: 10.0000 mg | ORAL_TABLET | ORAL | Status: DC | PRN

## 2018-03-11 MED ORDER — DIPHENHYDRAMINE HCL 25 MG PO CAPS: 25.0000 mg | ORAL_CAPSULE | Freq: Four times a day (QID) | ORAL | Status: DC | PRN

## 2018-03-11 MED ORDER — MEASLES, MUMPS & RUBELLA VAC ~~LOC~~ INJ
0.5000 mL | INJECTION | Freq: Once | SUBCUTANEOUS | Status: DC
  Filled 2018-03-11: qty 0.5

## 2018-03-11 MED ORDER — ACETAMINOPHEN 325 MG PO TABS: 650.0000 mg | ORAL_TABLET | ORAL | Status: DC | PRN

## 2018-03-11 MED ORDER — METHYLERGONOVINE MALEATE 0.2 MG/ML IJ SOLN: 0.2000 mg | INTRAMUSCULAR | Status: DC | PRN

## 2018-03-11 MED ORDER — COCONUT OIL OIL
1.0000 "application " | TOPICAL_OIL | Status: DC | PRN
  Administered 2018-03-12: 1 via TOPICAL
  Filled 2018-03-11: qty 120

## 2018-03-11 MED ORDER — ONDANSETRON HCL 4 MG/2ML IJ SOLN: 4.0000 mg | INTRAMUSCULAR | Status: DC | PRN

## 2018-03-11 MED ORDER — FERROUS SULFATE 325 (65 FE) MG PO TABS
325.0000 mg | ORAL_TABLET | Freq: Two times a day (BID) | ORAL | Status: DC
  Administered 2018-03-11 – 2018-03-13 (×5): 325 mg via ORAL
  Filled 2018-03-11 (×5): qty 1

## 2018-03-11 MED ORDER — DIBUCAINE 1 % RE OINT: 1.0000 "application " | TOPICAL_OINTMENT | RECTAL | Status: DC | PRN

## 2018-03-11 MED ORDER — SENNOSIDES-DOCUSATE SODIUM 8.6-50 MG PO TABS
2.0000 | ORAL_TABLET | ORAL | Status: DC
  Administered 2018-03-11 – 2018-03-13 (×2): 2 via ORAL
  Filled 2018-03-11: qty 2

## 2018-03-11 MED ORDER — PRENATAL MULTIVITAMIN CH
1.0000 | ORAL_TABLET | Freq: Every day | ORAL | Status: DC
  Administered 2018-03-11 – 2018-03-12 (×2): 1 via ORAL
  Filled 2018-03-11 (×2): qty 1

## 2018-03-11 MED ORDER — TETANUS-DIPHTH-ACELL PERTUSSIS 5-2.5-18.5 LF-MCG/0.5 IM SUSP: 0.5000 mL | Freq: Once | INTRAMUSCULAR | Status: DC

## 2018-03-11 MED ORDER — MEASLES, MUMPS & RUBELLA VAC ~~LOC~~ INJ: 0.5000 mL | INJECTION | Freq: Once | SUBCUTANEOUS | Status: DC

## 2018-03-11 MED ORDER — IBUPROFEN 600 MG PO TABS: 600.0000 mg | ORAL_TABLET | Freq: Four times a day (QID) | ORAL | Status: DC

## 2018-03-11 MED ORDER — METHYLERGONOVINE MALEATE 0.2 MG PO TABS: 0.2000 mg | ORAL_TABLET | ORAL | Status: DC | PRN

## 2018-03-11 MED ORDER — FLEET ENEMA 7-19 GM/118ML RE ENEM: 1.0000 | ENEMA | Freq: Every day | RECTAL | Status: DC | PRN

## 2018-03-11 MED ORDER — BENZOCAINE-MENTHOL 20-0.5 % EX AERO: 1.0000 "application " | INHALATION_SPRAY | CUTANEOUS | Status: DC | PRN

## 2018-03-11 MED ORDER — COCONUT OIL OIL: 1.0000 "application " | TOPICAL_OIL | Status: DC | PRN

## 2018-03-11 MED ORDER — OXYCODONE HCL 5 MG PO TABS: 5.0000 mg | ORAL_TABLET | ORAL | Status: DC | PRN

## 2018-03-11 NOTE — Lactation Note (Signed)
This note was copied from a baby's chart. Lactation Consultation Note Baby has elevated bili. Baby needs to be supplemented d/t SGA and wt. Will likely drop below 6 lbs.  LC recommends Similac 22 cal. To be supplemented w/after colostrum given.  Patient Name: Jasmin Wolfe [id number] Date: 03/11/2018 Reason for consult: Initial assessment;1st time breastfeeding   Maternal Data Has patient been taught Hand Expression?: Yes Does the patient have breastfeeding experience prior to this delivery?: No  Feeding Feeding Type: Breast Milk Length of feed: 20 min  LATCH Score Latch: Grasps breast easily, tongue down, lips flanged, rhythmical sucking.  Audible Swallowing: A few with stimulation  Type of Nipple: Everted at rest and after stimulation  Comfort (Breast/Nipple): Soft / non-tender  Hold (Positioning): Assistance needed to correctly position infant at breast and maintain latch.  LATCH Score: 8  Interventions Interventions: Breast feeding basics reviewed;Support pillows;Assisted with latch;Position options;Skin to skin;Expressed milk;Breast massage;Hand express;Hand pump;Breast compression;Adjust position;DEBP;Pre-pump if needed  Lactation Tools Discussed/Used Tools: Pump Breast pump type: Double-Electric Breast Pump;Manual WIC Program: Yes Pump Review: Setup, frequency, and cleaning;Milk Storage Initiated by:: Peri Jefferson RN IBCLC Date initiated:: 03/11/18   Consult Status Consult Status: Follow-up Date: 03/12/18 Follow-up type: In-patient    Charyl Dancer 03/11/2018, 11:50 PM

## 2018-03-11 NOTE — Anesthesia Postprocedure Evaluation (Signed)
Anesthesia Post Note  Patient: Jasmin Wolfe  Procedure(s) Performed: AN AD HOC LABOR EPIDURAL     Patient location during evaluation: Mother Baby Anesthesia Type: Epidural Level of consciousness: awake Pain management: pain level controlled Vital Signs Assessment: post-procedure vital signs reviewed and stable Respiratory status: spontaneous breathing Cardiovascular status: stable Postop Assessment: epidural receding and patient able to bend at knees Anesthetic complications: no    Last Vitals:  Vitals:   03/11/18 0400 03/11/18 0800  BP: 122/78 121/84  Pulse: 70 64  Resp: 17 16  Temp: 37 C 37.3 C  SpO2:  99%    Last Pain:  Vitals:   03/11/18 0800  TempSrc: Oral  PainSc:    Pain Goal:                 Edison Pace

## 2018-03-11 NOTE — Lactation Note (Signed)
This note was copied from a baby's chart. Lactation Consultation Note Baby 21 hrs old. Mom states BF going better and is starting to figure it out. First time mom has very compressible nipples. Large breast, hand expressed easily 5 ml colostrum. Baby birth wt. 6.2lbs. Discussed w/mom wt. Will probably drop below 6lbs. Discussed importance of supply and demand, newborn feeding habits, behavior of less than 6 lbs baby, I&O documentation, STS, cluster feeding and supplementation. Information sheet given on supplementing according to LPI guidelines. After BF attempted to supplement baby colostrum w/curve tip syring and gloved finger. Baby wouldn't suckle on finger, tongue thrust and spit out colostrum.  Discussed suck training. Mom shown how to use DEBP & how to disassemble, clean, & reassemble parts. Mom knows to pump q3h for 15-20 min. Mom encouraged to feed baby 8-12 times/24 hours and with feeding cues. Wake baby if hasn't cued in 3 hrs for feeding. Call for assistance or questions. WH/LC brochure given w/resources, support groups and LC services.  Patient Name: Jasmin Wolfe [id number] Date: 03/11/2018 Reason for consult: Initial assessment;1st time breastfeeding   Maternal Data Has patient been taught Hand Expression?: Yes Does the patient have breastfeeding experience prior to this delivery?: No  Feeding Feeding Type: Breast Fed Length of feed: 20 min  LATCH Score Latch: Grasps breast easily, tongue down, lips flanged, rhythmical sucking.  Audible Swallowing: A few with stimulation  Type of Nipple: Everted at rest and after stimulation  Comfort (Breast/Nipple): Soft / non-tender  Hold (Positioning): Assistance needed to correctly position infant at breast and maintain latch.  LATCH Score: 8  Interventions Interventions: Breast feeding basics reviewed;Support pillows;Assisted with latch;Position options;Skin to skin;Expressed milk;Breast massage;Hand express;Hand pump;Breast  compression;Adjust position;DEBP;Pre-pump if needed  Lactation Tools Discussed/Used Tools: Pump Breast pump type: Double-Electric Breast Pump;Manual WIC Program: Yes Pump Review: Setup, frequency, and cleaning;Milk Storage Initiated by:: Peri Jefferson RN IBCLC Date initiated:: 03/11/18   Consult Status Consult Status: Follow-up Date: 03/12/18 Follow-up type: In-patient    Charyl Dancer 03/11/2018, 10:12 PM

## 2018-03-11 NOTE — Progress Notes (Signed)
Vitals:   03/11/18 0001 03/11/18 0013  BP: 128/79   Pulse: 87   Resp: 18   Temp:  99.2 F (37.3 C)  SpO2: 100%     Urge to p ush. Some variable decels w/ctx, returning to baseline. Ctx q 2-3 minutes. Will start pushing

## 2018-03-11 NOTE — Lactation Note (Signed)
This note was copied from a baby's chart. Lactation Consultation Note  Patient Name: Jasmin Wolfe [id number] Date: 03/11/2018 Reason for consult: Initial assessment [name] born at 68 and 2. Mom with room full of visitors.  Visitors holding infant.  Infant cuing.  Offered to assist with feeding.  Mom reports she wants to  To wait until later to feed her and be seen by lactation.  Urged to feed on cue.   Maternal Data    Feeding Feeding Type: Breast Fed Length of feed: 15 min  LATCH Score                   Interventions    Lactation Tools Discussed/Used     Consult Status Consult Status: Follow-up Date: 03/11/18 Follow-up type: In-patient    Claudeen Leason Michaelle Copas 03/11/2018, 5:06 PM

## 2018-03-12 LAB — BIRTH TISSUE RECOVERY COLLECTION (PLACENTA DONATION)

## 2018-03-12 NOTE — Progress Notes (Signed)
Post Partum Day 1 Subjective: Pt without complaints today. Ambulating and voiding without problems. Tolerating diet. Pain controlled. Breast feeding.  Objective: Blood pressure 114/83, pulse 67, temperature 98.4 F (36.9 C), temperature source Oral, resp. rate 17, last menstrual period 05/26/2017, SpO2 100 %, unknown if currently breastfeeding.  Physical Exam:  General: alert Lochia: appropriate Uterine Fundus: firm Incision: healing well DVT Evaluation: No evidence of DVT seen on physical exam.  Recent Labs    03/10/18 0834  HGB 11.3*  HCT 33.3*    Assessment/Plan: Plan for discharge tomorrow   LOS: 2 days   Hermina Staggers 03/12/2018, 12:03 PM

## 2018-03-12 NOTE — Lactation Note (Addendum)
This note was copied from a baby's chart. Lactation Consultation Note  Patient Name: Jasmin Wolfe [id number] Date: 03/12/2018 Reason for consult: Follow-up assessment;Infant weight loss(5% weight loss ) Baby is 35 hours old , and per mom recently fed, also is being supplemented.  LC reviewed LC plan for a small baby and recommended every feeding attempt  To latch, if baby awake and sluggish , may need to give a 5-10 ml appetizer and then latch.  Feed at the breast 15 -20 mins, 30 mins max and supplement after wards and post pump  Both breast for 15 - 20 misn , save milk for next feeding.  And then the next feeding switch to the other breast to latch.  LC enc mom to call if needing breast feeding help , RN or LC could help.    Maternal Data    Feeding Feeding Type: (per mom baby recently fed ) Length of feed: 15 min  LATCH Score - ( Latch score by the [facility] )  Latch: Grasps breast easily, tongue down, lips flanged, rhythmical sucking.  Audible Swallowing: None  Type of Nipple: Everted at rest and after stimulation  Comfort (Breast/Nipple): Soft / non-tender  Hold (Positioning): Assistance needed to correctly position infant at breast and maintain latch.  LATCH Score: 7  Interventions Interventions: Breast feeding basics reviewed  Lactation Tools Discussed/Used Tools: Pump(LC enc post pumping )   Consult Status Consult Status: Follow-up Date: 03/13/18 Follow-up type: In-patient    Jasmin Wolfe Jasmin Wolfe 03/12/2018, 12:00 PM

## 2018-03-13 MED ORDER — SENNOSIDES-DOCUSATE SODIUM 8.6-50 MG PO TABS: 2.0000 | ORAL_TABLET | ORAL | 0 refills | Status: DC

## 2018-03-13 MED ORDER — IBUPROFEN 600 MG PO TABS: 600.0000 mg | ORAL_TABLET | Freq: Four times a day (QID) | ORAL | 0 refills | Status: AC

## 2018-03-13 NOTE — Discharge Instructions (Signed)
Postpartum Care After Vaginal Delivery °The period of time right after you deliver your newborn is called the postpartum period. °What kind of medical care will I receive? °· You may continue to receive fluids and medicines through an IV tube inserted into one of your veins. °· If an incision was made near your vagina (episiotomy) or if you had some vaginal tearing during delivery, cold compresses may be placed on your episiotomy or your tear. This helps to reduce pain and swelling. °· You may be given a squirt bottle to use when you go to the bathroom. You may use this until you are comfortable wiping as usual. To use the squirt bottle, follow these steps: °? Before you urinate, fill the squirt bottle with warm water. Do not use hot water. °? After you urinate, while you are sitting on the toilet, use the squirt bottle to rinse the area around your urethra and vaginal opening. This rinses away any urine and blood. °? You may do this instead of wiping. As you start healing, you may use the squirt bottle before wiping yourself. Make sure to wipe gently. °? Fill the squirt bottle with clean water every time you use the bathroom. °· You will be given sanitary pads to wear. °How can I expect to feel? °· You may not feel the need to urinate for several hours after delivery. °· You will have some soreness and pain in your abdomen and vagina. °· If you are breastfeeding, you may have uterine contractions every time you breastfeed for up to several weeks postpartum. Uterine contractions help your uterus return to its normal size. °· It is normal to have vaginal bleeding (lochia) after delivery. The amount and appearance of lochia is often similar to a menstrual period in the first week after delivery. It will gradually decrease over the next few weeks to a dry, yellow-brown discharge. For most women, lochia stops completely by 6-8 weeks after delivery. Vaginal bleeding can vary from woman to woman. °· Within the first few  days after delivery, you may have breast engorgement. This is when your breasts feel heavy, full, and uncomfortable. Your breasts may also throb and feel hard, tightly stretched, warm, and tender. After this occurs, you may have milk leaking from your breasts. Your health care provider can help you relieve discomfort due to breast engorgement. Breast engorgement should go away within a few days. °· You may feel more sad or worried than normal due to hormonal changes after delivery. These feelings should not last more than a few days. If these feelings do not go away after several days, speak with your health care provider. °How should I care for myself? °· Tell your health care provider if you have pain or discomfort. °· Drink enough water to keep your urine clear or pale yellow. °· Wash your hands thoroughly with soap and water for at least 20 seconds after changing your sanitary pads, after using the toilet, and before holding or feeding your baby. °· If you are not breastfeeding, avoid touching your breasts a lot. Doing this can make your breasts produce more milk. °· If you become weak or lightheaded, or you feel like you might faint, ask for help before: °? Getting out of bed. °? Showering. °· Change your sanitary pads frequently. Watch for any changes in your flow, such as a sudden increase in volume, a change in color, the passing of large blood clots. If you pass a blood clot from your vagina, save it   to show to your health care provider. Do not flush blood clots down the toilet without having your health care provider look at them. °· Make sure that all your vaccinations are up to date. This can help protect you and your baby from getting certain diseases. You may need to have immunizations done before you leave the hospital. °· If desired, talk with your health care provider about methods of family planning or birth control (contraception). °How can I start bonding with my baby? °Spending as much time as  possible with your baby is very important. During this time, you and your baby can get to know each other and develop a bond. Having your baby stay with you in your room (rooming in) can give you time to get to know your baby. Rooming in can also help you become comfortable caring for your baby. Breastfeeding can also help you bond with your baby. °How can I plan for returning home with my baby? °· Make sure that you have a car seat installed in your vehicle. °? Your car seat should be checked by a certified car seat installer to make sure that it is installed safely. °? Make sure that your baby fits into the car seat safely. °· Ask your health care provider any questions you have about caring for yourself or your baby. Make sure that you are able to contact your health care provider with any questions after leaving the hospital. °This information is not intended to replace advice given to you by your health care provider. Make sure you discuss any questions you have with your health care provider. °Document Released: 04/19/2007 Document Revised: 11/25/2015 Document Reviewed: 05/27/2015 °Elsevier Interactive Patient Education © 2018 [employer] ° °

## 2018-03-13 NOTE — Discharge Summary (Signed)
OB Discharge Summary     Patient Name: Jasmin Wolfe DOB: May 11, 1996 MRN: [id number]  Date of admission: 03/10/2018 Delivering MD: Jacklyn Shell   Date of discharge: 03/13/2018  Admitting diagnosis: INDUCTION Intrauterine pregnancy: [redacted]w[redacted]d     Secondary diagnosis:  Active Problems:   Post-dates pregnancy  Additional problems: None      Discharge diagnosis: Term Pregnancy Delivered                                                                                                Post partum procedures:none  Augmentation: Pitocin and Foley Balloon  Complications: None  Hospital course:  Induction of Labor With Vaginal Delivery   22 y.o. yo G2P1011 at [redacted]w[redacted]d was admitted to the hospital 03/10/2018 for induction of labor.  Indication for induction: Postdates.  Patient had an uncomplicated labor course as follows: Membrane Rupture Time/Date: 7:15 PM ,03/10/2018   Intrapartum Procedures: Episiotomy: None [1]                                         Lacerations:  1st degree [2];Labial [10]  Patient had delivery of a Viable infant.  Information for the patient's newborn:  Genetha, Colliver [[id number]]      03/11/2018  Details of delivery can be found in separate delivery note.  Patient had a routine postpartum course. Patient is discharged home 03/13/18.  Physical exam  Vitals:   03/12/18 0426 03/12/18 1531 03/12/18 2317 03/13/18 0512  BP: 114/83 122/85 114/72 120/74  Pulse: 67 70 76 65  Resp: 17 18 18 18   Temp: 98.4 F (36.9 C) (!) 97.4 F (36.3 C) (!) 97.4 F (36.3 C) 97.9 F (36.6 C)  TempSrc: Oral Oral Oral Oral  SpO2:   (!) 81%    General: alert and cooperative Lochia: appropriate Uterine Fundus: firm Incision: N/A DVT Evaluation: No evidence of DVT seen on physical exam. Labs: Lab Results  Component Value Date   WBC 7.6 03/10/2018   HGB 11.3 (L) 03/10/2018   HCT 33.3 (L) 03/10/2018   MCV 86.9 03/10/2018   PLT 190 03/10/2018   No flowsheet data  found.  Discharge instruction: per After Visit Summary and "Baby and Me Booklet".  After visit meds:  Allergies as of 03/13/2018   No Known Allergies     Medication List    TAKE these medications   ferrous sulfate 325 (65 FE) MG tablet Take 1 tablet (325 mg total) by mouth daily with breakfast.   ibuprofen 600 MG tablet Commonly known as:  ADVIL,MOTRIN Take 1 tablet (600 mg total) by mouth every 6 (six) hours.   PRENATAL GUMMIES/DHA & FA 0.4-32.5 MG Chew Chew 1 tablet by mouth daily.   senna-docusate 8.6-50 MG tablet Commonly known as:  Senokot-S Take 2 tablets by mouth daily. Start taking on:  03/14/2018       Diet: routine diet  Activity: Advance as tolerated. Pelvic rest for 6 weeks.   Outpatient follow up:6 weeks Follow up Appt: Future Appointments  Date Time Provider Department Center  04/19/2018  2:35 PM Leftwich-Kirby, Wilmer Floor, CNM [facility]   Follow up Visit:No follow-ups on file.  Postpartum contraception: Condoms  Newborn Data: Live born female  Birth Weight: 6 lb 2.9 oz (2804 g) APGAR: 8, 9  Newborn Delivery   Birth date/time:  03/11/2018 00:51:00 Delivery type:  Vaginal, Spontaneous     Baby Feeding: Breast Disposition:home with mother   03/13/2018 De Hollingshead, DO

## 2018-03-14 ENCOUNTER — Encounter: Payer: Self-pay | Admitting: *Deleted

## 2018-03-23 ENCOUNTER — Encounter: Payer: Self-pay | Admitting: *Deleted

## 2018-04-19 ENCOUNTER — Ambulatory Visit (INDEPENDENT_AMBULATORY_CARE_PROVIDER_SITE_OTHER): Payer: Medicaid Other | Admitting: Advanced Practice Midwife

## 2018-04-19 ENCOUNTER — Encounter: Payer: Self-pay | Admitting: Advanced Practice Midwife

## 2018-04-19 DIAGNOSIS — Z1389 Encounter for screening for other disorder: Secondary | ICD-10-CM | POA: Diagnosis not present

## 2018-04-19 DIAGNOSIS — Z3009 Encounter for other general counseling and advice on contraception: Secondary | ICD-10-CM

## 2018-04-19 NOTE — Progress Notes (Signed)
Subjective:     Jasmin Wolfe is a 22 y.o. female who presents for a postpartum visit. She is 5 weeks postpartum following a spontaneous vaginal delivery. I have fully reviewed the prenatal and intrapartum course. The delivery was at 41/2 gestational weeks. Outcome: spontaneous vaginal delivery. Anesthesia: none. Postpartum course has been normal. Baby's course has been normal. Baby is feeding by breast. Bleeding no bleeding. Bowel function is normal. Bladder function is normal. Patient is not sexually active. Contraception method is none. Postpartum depression screening: negative.  The following portions of the patient's history were reviewed and updated as appropriate: allergies, current medications, past family history, past medical history, past social history, past surgical history and problem list.  Review of Systems Pertinent items noted in HPI and remainder of comprehensive ROS otherwise negative.   Objective:    BP (!) 95/49   Pulse 69   Ht 5\' 9"  (1.753 m)   Wt 102.3 kg   LMP 05/26/2017 (Exact Date)   Breastfeeding? No   BMI 33.32 kg/m    VS reviewed, nursing note reviewed,  Constitutional: well developed, well nourished, no distress HEENT: normocephalic HEART: normal rate, heart sounds, regular rhythm RESP: normal effort, lung sounds clear and equal bilaterally Abdomen: soft Neuro: alert and oriented x 3 Skin: warm, dry Psych: affect normal      Assessment/Plan:   1. Postpartum examination following vaginal delivery --doing well, no complications.  2. Encounter for counseling regarding contraception --Discussed LARCs as most effective forms of birth control.  Discussed benefits/risks of other methods.  Pt desires condoms.  Pt may call to schedule contraceptive visit in the future.   Social/emotional concerns:  None, pt reports lots of family support, adjusting well to new baby.  Contraception: condoms  Follow up in: 1 year or as needed.   Sharen Counter,  CNM 3:35 PM

## 2018-04-19 NOTE — Patient Instructions (Signed)

## 2018-04-19 NOTE — Progress Notes (Signed)
Pt does not want to discuss Birth Control, states will just use condoms.

## 2019-01-23 IMAGING — US US MFM OB DETAIL+14 WK
1 series · 14 of 28 positions shown · non-contrast
Comparison: none

[Series 1: us mfm ob detail+14 wk · 73 acquisitions, 14 frames shown]
[im 3/73]
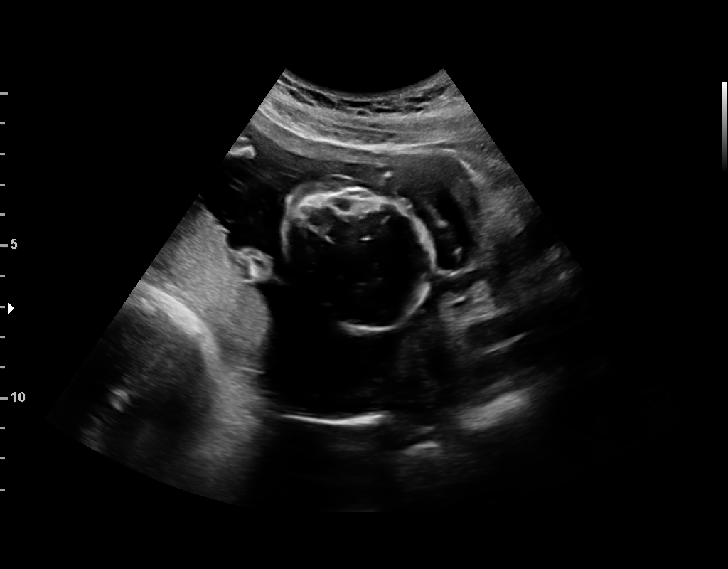
[im 9/73]
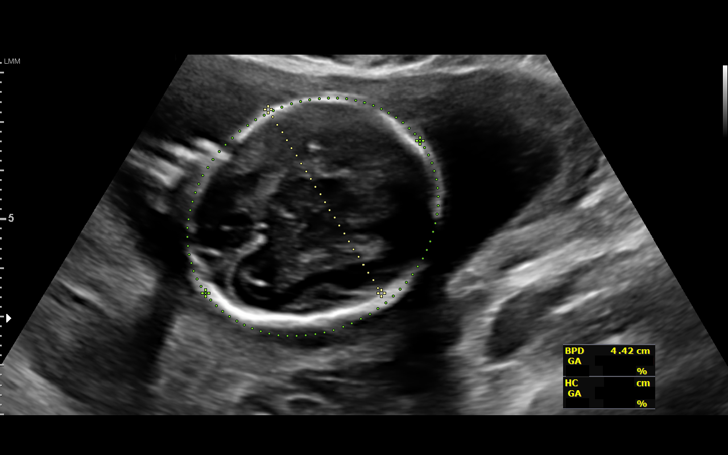
[im 14/73]
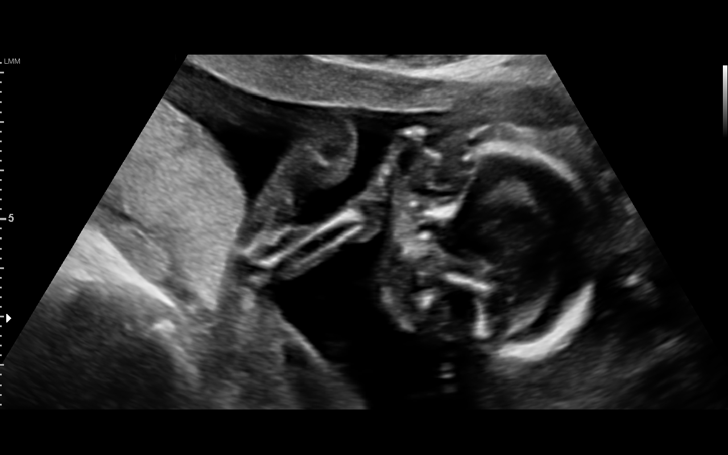
[im 19/73]
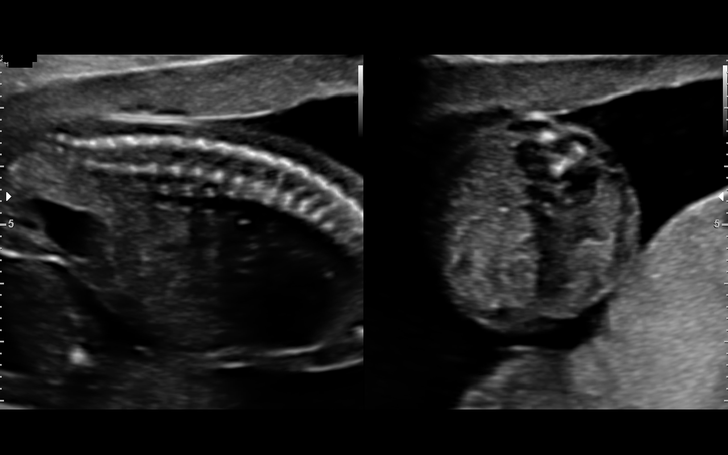
[im 25/73]
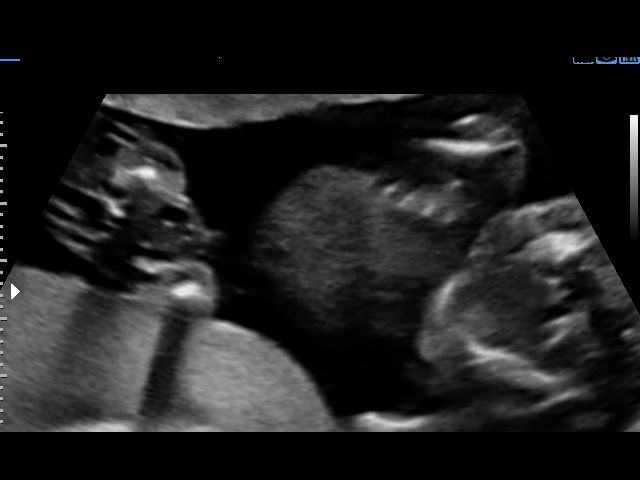
[im 30/73]
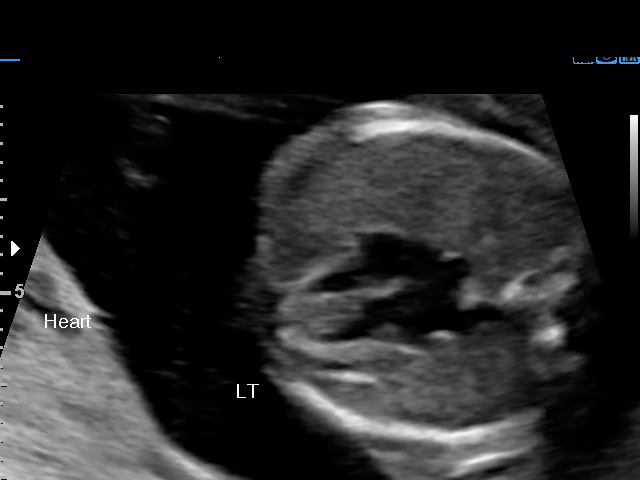
[im 35/73]
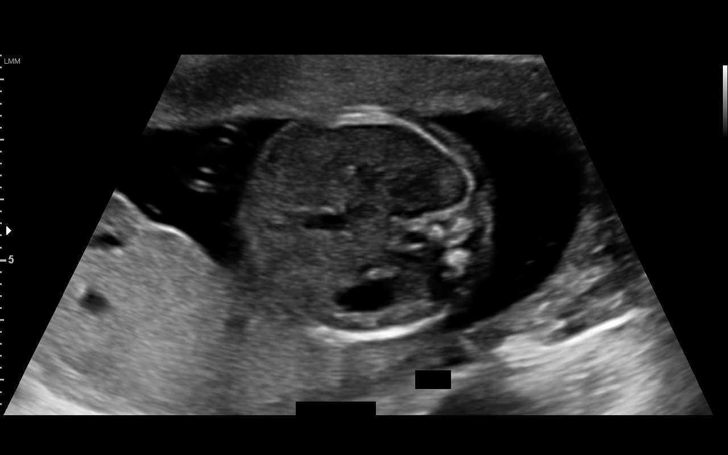
[im 41/73]
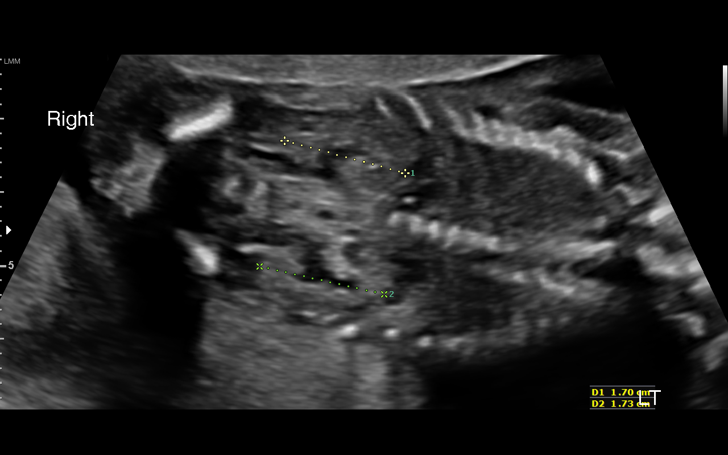
[im 46/73]
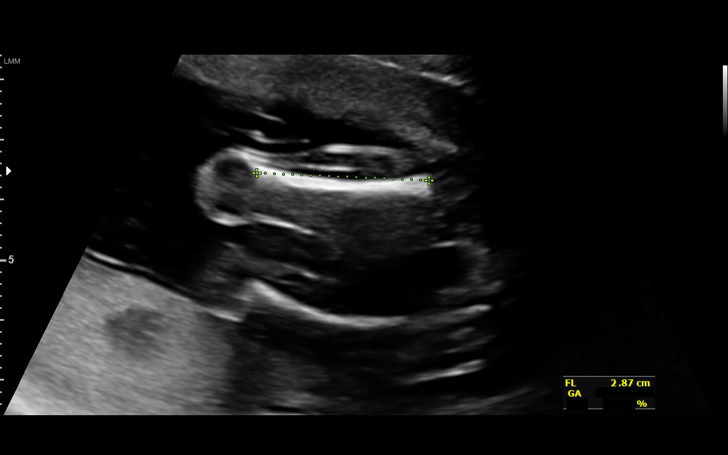
[im 51/73]
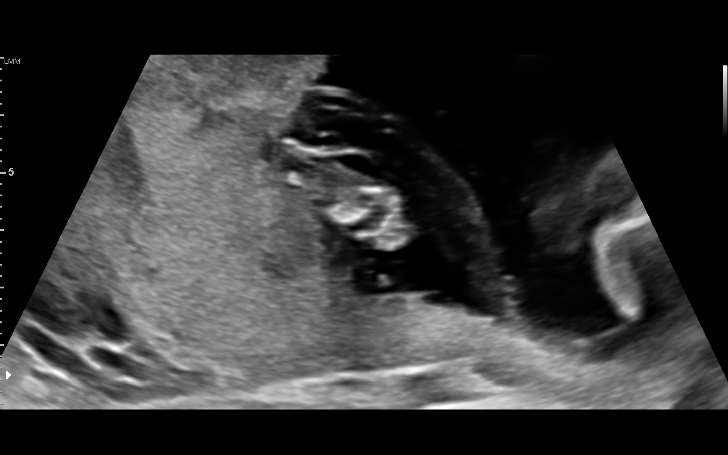
[im 57/73]
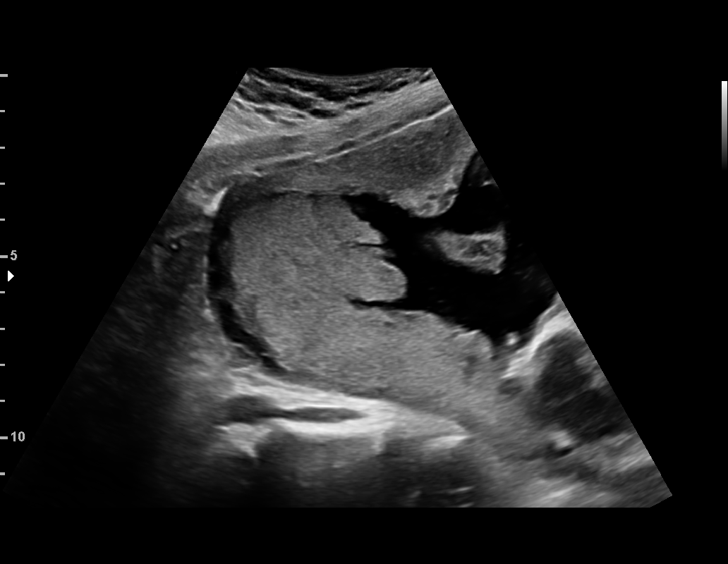
[im 62/73]
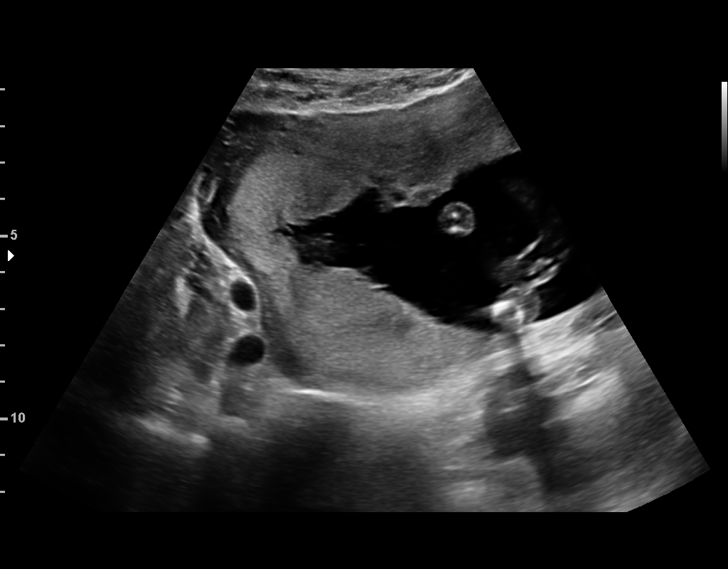
[im 67/73]
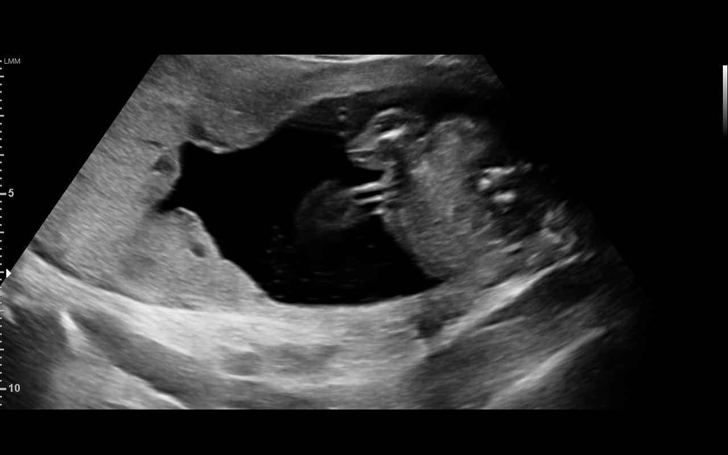
[im 73/73]
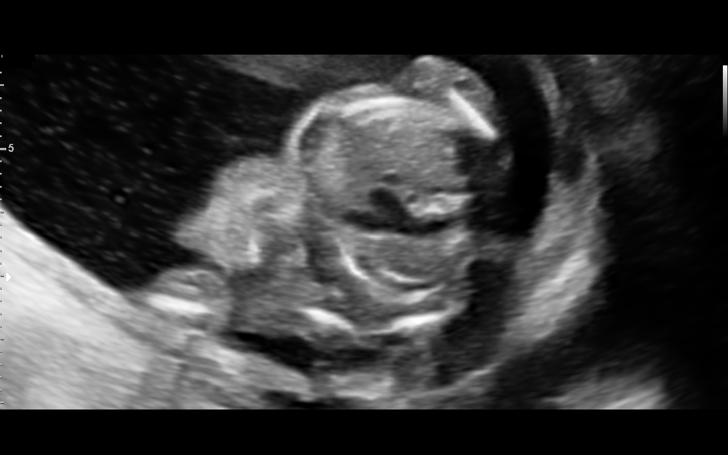

[14 of 28 positions shown; findings below may reference images not displayed]

am)

[REDACTED]. [HOSPITAL],

1  MIM AKTHER LOHAGARA            [id number]      [id number]     [id number]
Indications

19 weeks gestation of pregnancy
Encounter for fetal anatomic survey (low-risk
NIPS; MSAFP screen negative)
Obesity complicating pregnancy, second
trimester
OB History

Gravidity:    2          SAB:   1
Fetal Evaluation

Num Of Fetuses:     1
Fetal Heart         145
Rate(bpm):
Cardiac Activity:   Observed
Presentation:       Cephalic
Placenta:           Right lateral, above cervical os
P. Cord Insertion:  Visualized, central

Amniotic Fluid
AFI FV:      Subjectively within normal limits

Largest Pocket(cm)
5.0
Biometry

BPD:      44.1  mm     G. Age:  19w 2d         53  %    CI:        76.88   %    70 - 86
FL/HC:      18.0   %    16.1 -
HC:      159.3  mm     G. Age:  18w 6d         21  %    HC/AC:      1.24        1.09 -
AC:      128.8  mm     G. Age:  18w 3d         20  %    FL/BPD:     65.1   %
FL:       28.7  mm     G. Age:  18w 6d         27  %    FL/AC:      22.3   %    20 - 24
HUM:      26.8  mm     G. Age:  18w 4d         30  %
CER:      17.9  mm     G. Age:  17w 6d         10  %
NFT:         3  mm

CM:        4.7  mm

Est. FW:     251  gm      0 lb 9 oz     34  %
Gestational Age

LMP:           19w 2d        Date:  05/26/17                 EDD:   03/02/18
U/S Today:     18w 6d                                        EDD:   03/05/18
Best:          19w 2d     Det. By:  LMP  (05/26/17)          EDD:   03/02/18
Anatomy

Cranium:               Appears normal         Aortic Arch:            Not well visualized
Cavum:                 Appears normal         Ductal Arch:            Not well visualized
Ventricles:            Appears normal         Diaphragm:              Appears normal
Choroid Plexus:        Appears normal         Stomach:                Appears normal, left
sided
Cerebellum:            Appears normal         Abdomen:                Appears normal
Posterior Fossa:       Appears normal         Abdominal Wall:         Appears nml (cord
insert, abd wall)
Nuchal Fold:           Appears normal         Cord Vessels:           Appears normal (3
vessel cord)
Face:                  Appears normal         Kidneys:                Bil pyelectasis,Rt
(orbits and profile)
4.5mm,Lt 5.1mm
Lips:                  Appears normal         Bladder:                Appears normal
Thoracic:              Appears normal         Spine:                  Appears normal
Heart:                 Appears normal         Upper Extremities:      Appears normal
(4CH, axis, and situs
RVOT:                  Not well visualized    Lower Extremities:      Appears normal
LVOT:                  Appears normal

Other:  Female gender. Right heel visualized. Technically difficult due to fetal
position.
Cervix Uterus Adnexa

Cervix
Length:           3.13  cm.
Normal appearance by transabdominal scan.

Uterus
No abnormality visualized.

Left Ovary
Within normal limits.

Right Ovary
Within normal limits.

Cul De Sac:   No free fluid seen.

Adnexa:       No abnormality visualized.
Impression

Singleton intrauterine pregnancy at 19+2 weeks with obesity
here for anatomic survey
Review of the anatomy shows bilateral UTD as noted above;
otherwise no sonographic markers for aneuploidy or
structural anomalies
However, views of the fetal heart should be considered
suboptimal secondary to
Amniotic fluid volume is normal
Estimated fetal weight shows growth in the 34th percentile
Recommendations

Recommend follow-up ultrasound examination in 4 weeks for
completion of anatomic survey and reassessment of UTD

## 2019-03-30 IMAGING — US US MFM OB FOLLOW-UP
1 series · 14 of 28 positions shown · non-contrast
Comparison: none

[Series 1: us mfm ob follow-up · 48 acquisitions, 14 frames shown]
[im 2/48]
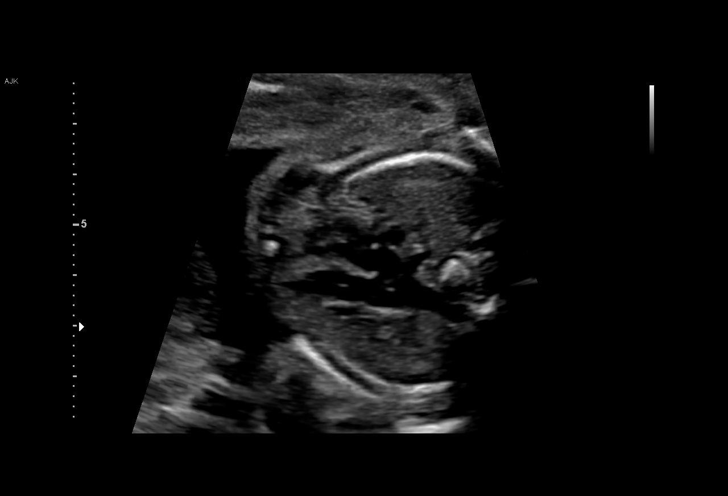
[im 6/48]
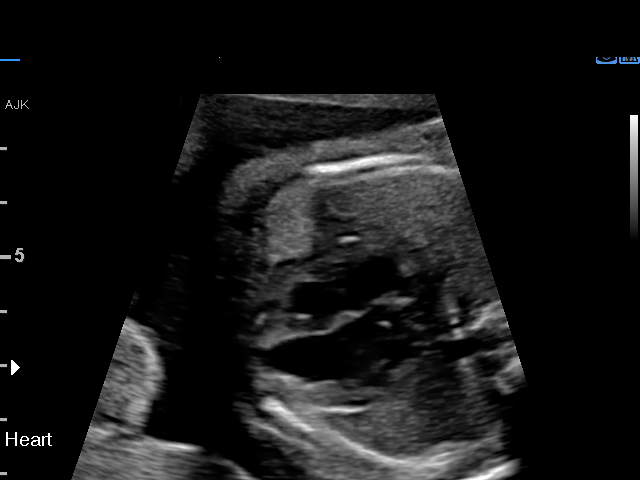
[im 9/48]
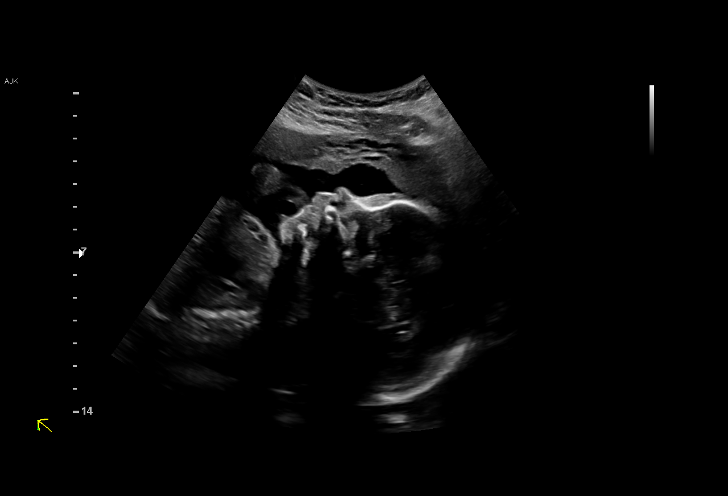
[im 13/48]
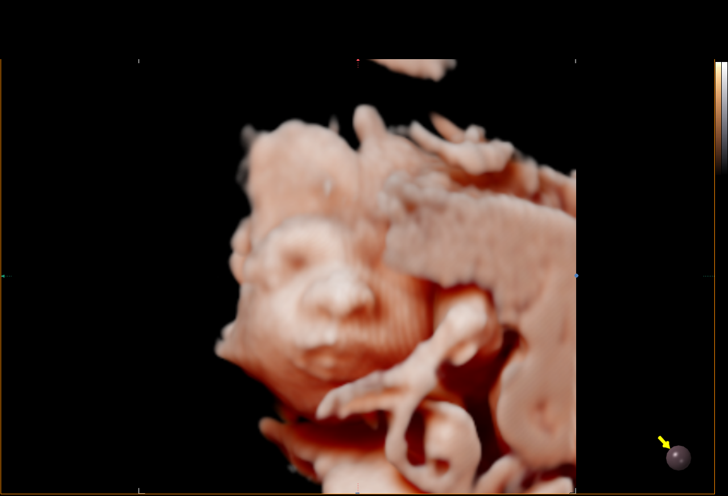
[im 16/48]
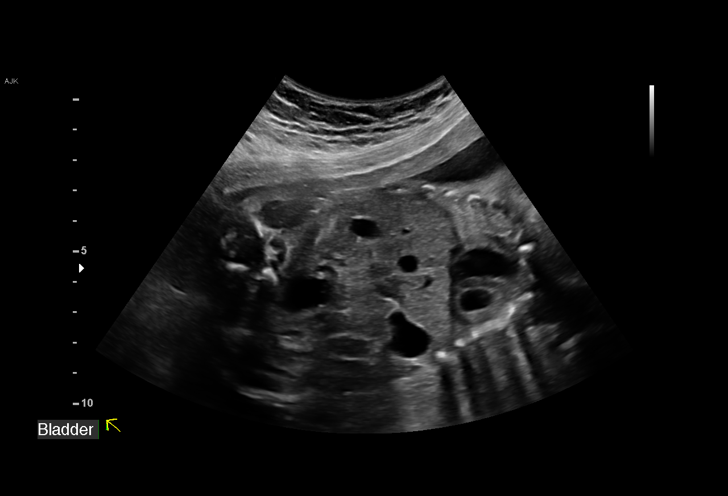
[im 20/48]
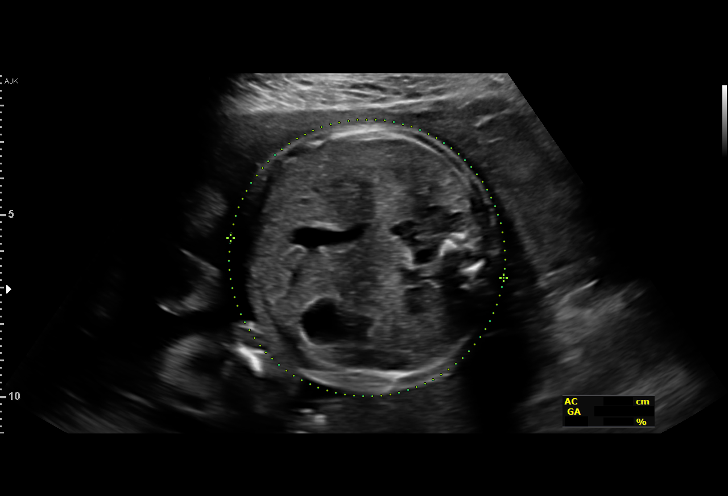
[im 23/48]
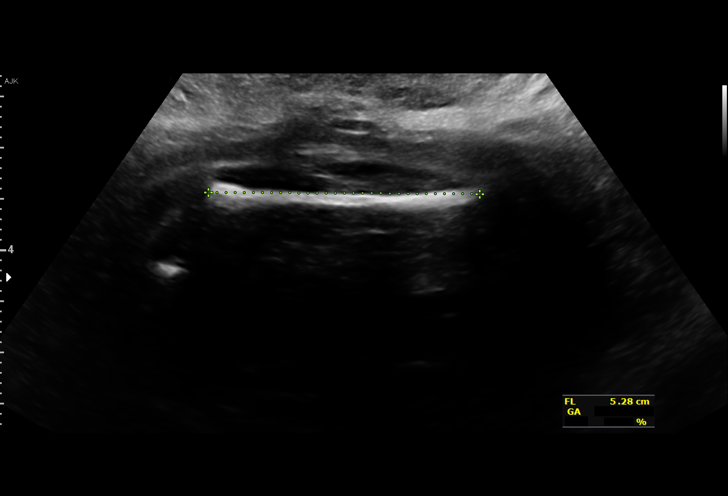
[im 27/48]
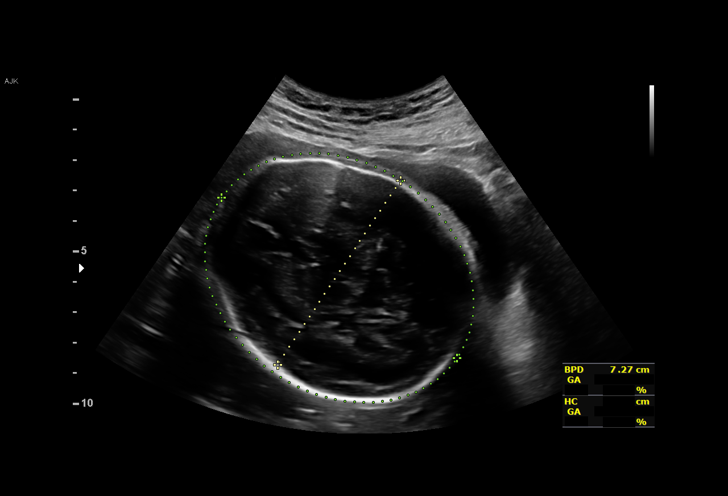
[im 30/48]
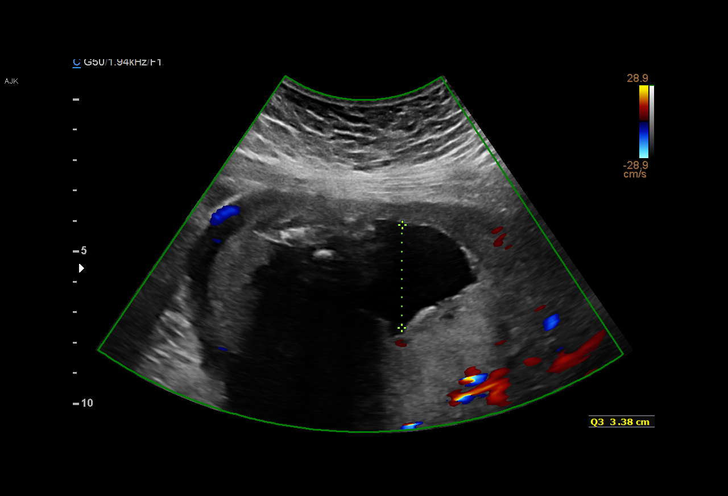
[im 34/48]
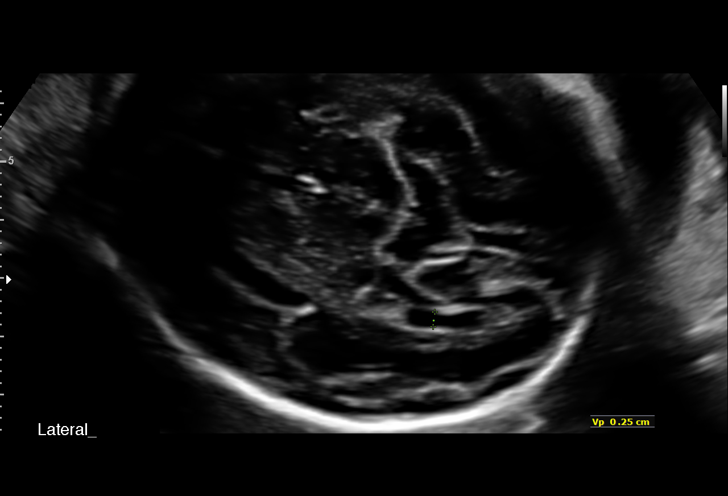
[im 37/48]
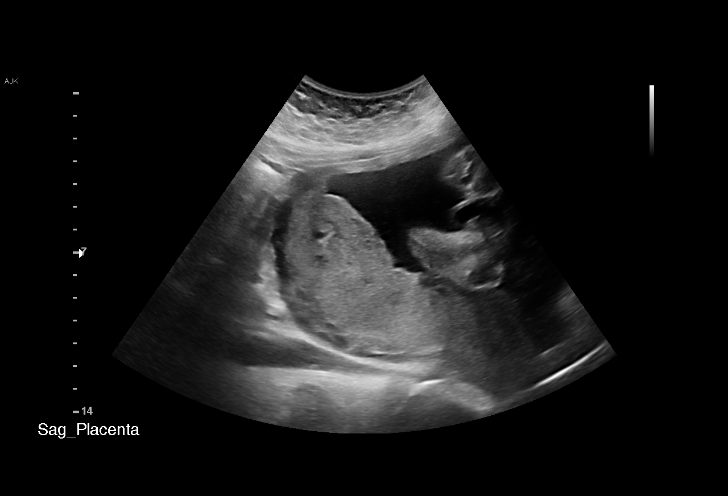
[im 41/48]
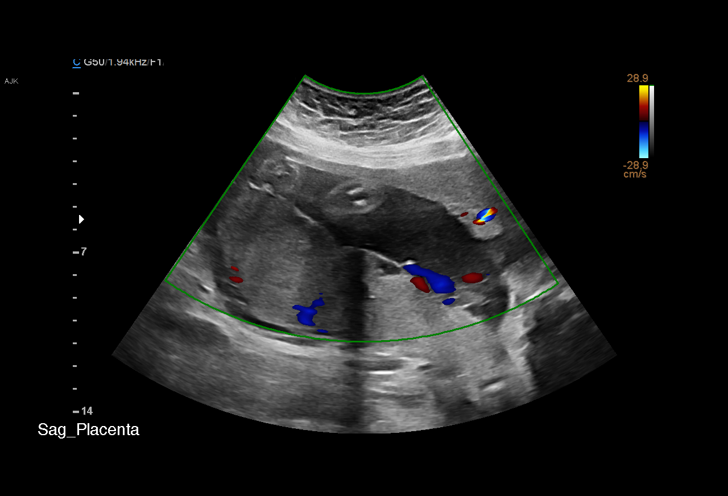
[im 44/48]
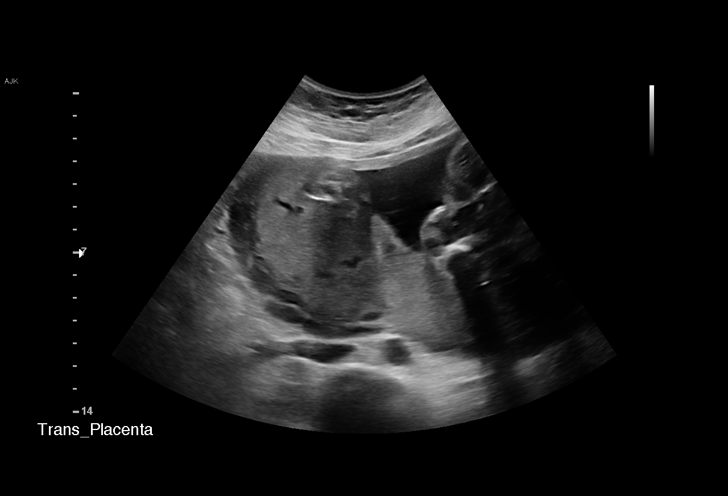
[im 48/48]
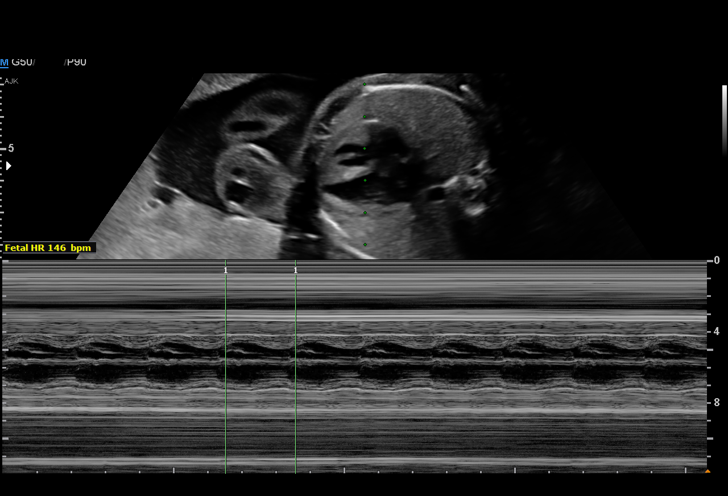

[14 of 28 positions shown; findings below may reference images not displayed]

[REDACTED]. [HOSPITAL],

Indications

28 weeks gestation of pregnancy
Encounter for other antenatal screening
follow-up
Obesity complicating pregnancy, third
trimester
OB History

Gravidity:    2          SAB:   1
Fetal Evaluation

Num Of Fetuses:     1
Fetal Heart         146
Rate(bpm):
Cardiac Activity:   Observed
Presentation:       Cephalic
Placenta:           Posterior, above cervical os
P. Cord Insertion:  Visualized

Amniotic Fluid
AFI FV:      Subjectively within normal limits

AFI Sum(cm)     %Tile       Largest Pocket(cm)
11.8            27

RUQ(cm)       RLQ(cm)       LUQ(cm)        LLQ(cm)
1.34
Biometry
BPD:      73.1  mm     G. Age:  29w 2d         58  %    CI:        76.34   %    70 - 86
FL/HC:      20.1   %    19.6 -
HC:      265.1  mm     G. Age:  28w 6d         23  %    HC/AC:      1.12        0.99 -
AC:      236.3  mm     G. Age:  28w 0d         22  %    FL/BPD:     72.9   %    71 - 87
FL:       53.3  mm     G. Age:  28w 2d         24  %    FL/AC:      22.6   %    20 - 24

Est. FW:    7753  gm    2 lb 10 oz      41  %
Gestational Age

LMP:           28w 5d        Date:  05/26/17                 EDD:   03/02/18
U/S Today:     28w 4d                                        EDD:   03/03/18
Best:          28w 5d     Det. By:  LMP  (05/26/17)          EDD:   03/02/18
Anatomy

Cranium:               Appears normal         Aortic Arch:            Previously seen
Cavum:                 Appears normal         Ductal Arch:            Previously seen
Ventricles:            Appears normal         Diaphragm:              Appears normal
Choroid Plexus:        Previously seen        Stomach:                Appears normal, left
sided
Cerebellum:            Previously seen        Abdomen:                Appears normal
Posterior Fossa:       Previously seen        Abdominal Wall:         Previously seen
Nuchal Fold:           Not applicable (>20    Cord Vessels:           Previously seen
wks GA)
Face:                  Orbits and profile     Kidneys:                Previously seen
previously seen
Lips:                  Appears normal         Bladder:                Appears normal
Thoracic:              Appears normal         Spine:                  Previously seen
Heart:                 Appears normal         Upper Extremities:      Previously seen
(4CH, axis, and situs
RVOT:                  Appears normal         Lower Extremities:      Previously seen
LVOT:                  Appears normal

Other:  Female gender. Right heel previously visualized. Technically difficult
due to fetal position.
Cervix Uterus Adnexa

Cervix
Not visualized (advanced GA >35wks)

Uterus
No abnormality visualized.

Left Ovary
Not visualized.

Right Ovary
Not visualized.

Adnexa:       No abnormality visualized.
Impression

Single living intrauterine pregnancy at 28w 5d.
Cephalic presentation.
Placenta Posterior, above cervical os.
Normal amniotic fluid volume.
Appropriate interval fetal growth.
Normal interval fetal anatomy.
Recommendations

Given obesity, interval growth  in 6 weeks.
